# Patient Record
Sex: Male | Born: 1995 | Race: White | Hispanic: No | Marital: Single | Smoking: Never smoker
Health system: Southern US, Community
[De-identification: ages and names within clinical notes are randomized; demographics above are authoritative.]

## PROBLEM LIST (undated history)

## (undated) DIAGNOSIS — F419 Anxiety disorder, unspecified: Secondary | ICD-10-CM

## (undated) DIAGNOSIS — F845 Asperger's syndrome: Secondary | ICD-10-CM

## (undated) DIAGNOSIS — R51 Headache: Secondary | ICD-10-CM

## (undated) DIAGNOSIS — F988 Other specified behavioral and emotional disorders with onset usually occurring in childhood and adolescence: Secondary | ICD-10-CM

## (undated) HISTORY — DX: Headache: R51

## (undated) HISTORY — PX: CIRCUMCISION: SHX1350

---
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
## 2017-10-30 ENCOUNTER — Emergency Department (HOSPITAL_COMMUNITY): Payer: Medicaid Other

## 2017-10-30 ENCOUNTER — Encounter (HOSPITAL_COMMUNITY): Payer: Self-pay

## 2017-10-30 ENCOUNTER — Emergency Department (HOSPITAL_COMMUNITY)
Admission: EM | Admit: 2017-10-30 | Discharge: 2017-10-30 | Disposition: A | Discharge status: Home / Self Care | Payer: Medicaid Other | Attending: Emergency Medicine | Admitting: Emergency Medicine

## 2017-10-30 ENCOUNTER — Other Ambulatory Visit: Payer: Self-pay

## 2017-10-30 DIAGNOSIS — Z79899 Other long term (current) drug therapy: Secondary | ICD-10-CM | POA: Diagnosis not present

## 2017-10-30 DIAGNOSIS — N201 Calculus of ureter: Secondary | ICD-10-CM | POA: Insufficient documentation

## 2017-10-30 DIAGNOSIS — F845 Asperger's syndrome: Secondary | ICD-10-CM | POA: Insufficient documentation

## 2017-10-30 DIAGNOSIS — R1031 Right lower quadrant pain: Secondary | ICD-10-CM | POA: Diagnosis present

## 2017-10-30 LAB — URINALYSIS, ROUTINE W REFLEX MICROSCOPIC
Bacteria, UA: NONE SEEN
Bilirubin Urine: NEGATIVE
Glucose, UA: NEGATIVE mg/dL
Ketones, ur: NEGATIVE mg/dL
Leukocytes, UA: NEGATIVE
Nitrite: NEGATIVE
Protein, ur: 30 mg/dL — AB
RBC / HPF: >50 RBC/hpf — ABNORMAL HIGH (ref 0–5)
Specific Gravity, Urine: 1.019 (ref 1.005–1.030)
pH: 5 (ref 5.0–8.0)

## 2017-10-30 MED ORDER — ONDANSETRON HCL 4 MG PO TABS: 4.0000 mg | ORAL_TABLET | Freq: Three times a day (TID) | ORAL | 0 refills | Status: DC | PRN

## 2017-10-30 NOTE — Discharge Instructions (Addendum)
Drink plenty of fluids. You can take advil 3 tablets (600 mg) every 6 hrs as needed for pain. Use the zofran for nausea or vomiting.  Keep your appointment on Monday, September 30 with your primary care doctor.  Return to the emergency department if you get fever, or have uncontrolled vomiting or pain.  You should consider seeing a urologist.

## 2017-10-30 NOTE — ED Notes (Signed)
Pt attempted but was unable to provide urine sample 

## 2017-10-30 NOTE — ED Triage Notes (Signed)
Pt states a week ago he was diagnosed with uti and possible kidney stone.  Pt has been passing bright red blood intermittently since Tuesday.

## 2017-10-30 NOTE — ED Provider Notes (Signed)
[facility] EMERGENCY DEPARTMENT Provider Note   CSN: [id number] Arrival date & time: 10/30/17  0533  Time seen 05:58 AM   History   Chief Complaint Chief Complaint  Patient presents with  . Groin Pain    hematuria    HPI Joshua Duffy is a 22 y.o. male.  HPI patient reports he started seeing blood in his urine in September 24.  He states it is not every time he urinates but most times.  He states he has discomfort when he urinates.  He has had nausea without vomiting.  He has had some fleeting pains in his right abdomen but this morning at 3 AM he was having pain in his flank that radiated into his right lower quadrant and his right groin.  He states his right testicle is a little bit sore.  He was seen by his PCP on September 24 and was noted to have blood in his urine.  He has had crystals in his urine in the past.  He was started on Cipro 500 mg twice a day and he is to have a repeat urine done in the office on the 30th.  His grandmother who he lives with states his father's brother (uncle)  and several of his cousins on the maternal side of the family have kidney stones.  Patient took ibuprofen 400 mg at 4 AM.  PCP Assunta Found, MD   Past Medical History:  Diagnosis Date  . Asperger syndrome   . Attention deficit disorder   . WGNFAOZH(086.5)     Patient Active Problem List   Diagnosis Date Noted  . ADHD (attention deficit hyperactivity disorder) 06/29/2012  . Anxiety state, unspecified 06/29/2012  . Migraine without aura, without mention of intractable migraine without mention of status migrainosus 05/19/2012  . Tension headache 05/19/2012  . Unspecified mental or behavioral problem 05/19/2012    Past Surgical History:  Procedure Laterality Date  . CIRCUMCISION          Home Medications    Prior to Admission medications   Medication Sig Start Date End Date Taking? Authorizing Provider  ciprofloxacin (CIPRO) 500 MG tablet Take 500 mg by mouth 2 (two) times  daily.   Yes [provider]  cloNIDine (CATAPRES) 0.2 MG tablet Take 0.2 mg by mouth at bedtime.   Yes [provider]  GuanFACINE HCl 3 MG TB24 Take 1 tablet by mouth daily. 12/09/15  Yes [provider]  ibuprofen (ADVIL,MOTRIN) 200 MG tablet Take 200-800 mg by mouth every 6 (six) hours as needed for headache.   Yes [provider]  lisdexamfetamine (VYVANSE) 20 MG capsule Take 20 mg by mouth every morning.   Yes [provider]  montelukast (SINGULAIR) 5 MG chewable tablet Chew 5 mg by mouth at bedtime.   Yes [provider]  omeprazole (PRILOSEC) 20 MG capsule Take 20 mg by mouth at bedtime.   Yes [provider]  amoxicillin-clavulanate (AUGMENTIN) 875-125 MG tablet Take 1 tablet by mouth 2 (two) times daily. for 10 days 12/09/15   [provider]  ondansetron (ZOFRAN) 4 MG tablet Take 1 tablet (4 mg total) by mouth every 8 (eight) hours as needed. 10/30/17   Devoria Albe, MD    Family History Family History  Problem Relation Age of Onset  . Headache Mother   . Headache Maternal Grandmother   . Seizures Paternal Grandfather   . Headache Other     Social History Social History   Tobacco Use  .  Smoking status: Never Smoker  . Smokeless tobacco: Never Used  Substance Use Topics  . Alcohol use: No  . Drug use: No  lives with GM   Allergies   Codeine   Review of Systems Review of Systems  All other systems reviewed and are negative.    Physical Exam Updated Vital Signs BP (!) 143/90 (BP Location: Left Arm)   Pulse 92   Temp 97.8 F (36.6 C) (Oral)   Resp 18   Ht 5\' 6"  (1.676 m)   Wt 79.4 kg   SpO2 99%   BMI 28.25 kg/m   Vital signs normal    Physical Exam  Constitutional: He is oriented to person, place, and time. He appears well-developed and well-nourished.  Non-toxic appearance. He does not appear ill. No distress.  HENT:  Head: Normocephalic and atraumatic.  Right Ear: External ear  normal.  Left Ear: External ear normal.  Nose: Nose normal. No mucosal edema or rhinorrhea.  Mouth/Throat: Oropharynx is clear and moist and mucous membranes are normal. No dental abscesses or uvula swelling.  Eyes: Pupils are equal, round, and reactive to light. Conjunctivae and EOM are normal.  Neck: Normal range of motion and full passive range of motion without pain. Neck supple.  Cardiovascular: Normal rate, regular rhythm and normal heart sounds. Exam reveals no gallop and no friction rub.  No murmur heard. Pulmonary/Chest: Effort normal and breath sounds normal. No respiratory distress. He has no wheezes. He has no rhonchi. He has no rales. He exhibits no tenderness and no crepitus.  Abdominal: Soft. Normal appearance and bowel sounds are normal. He exhibits no distension. There is no tenderness. There is no rebound and no guarding.  Indicates he did have pain in his right flank earlier and in his right abdomen.  Genitourinary:  Genitourinary Comments: Patient's left testicle is nontender, the right testicle is the same size as the left and has mild tenderness but he is most tender more in the groin area.  Musculoskeletal: Normal range of motion. He exhibits no edema or tenderness.  Moves all extremities well.   Neurological: He is alert and oriented to person, place, and time. He has normal strength. No cranial nerve deficit.  Skin: Skin is warm, dry and intact. No rash noted. No erythema. No pallor.  Psychiatric: He has a normal mood and affect. His speech is normal and behavior is normal. His mood appears not anxious.  Nursing note and vitals reviewed.    ED Treatments / Results  Labs (all labs ordered are listed, but only abnormal results are displayed) Results for orders placed or performed during the hospital encounter of 10/30/17  Urinalysis, Routine w reflex microscopic  Result Value Ref Range   Color, Urine YELLOW YELLOW   APPearance HAZY (A) CLEAR   Specific Gravity,  Urine 1.019 1.005 - 1.030   pH 5.0 5.0 - 8.0   Glucose, UA NEGATIVE NEGATIVE mg/dL   Hgb urine dipstick LARGE (A) NEGATIVE   Bilirubin Urine NEGATIVE NEGATIVE   Ketones, ur NEGATIVE NEGATIVE mg/dL   Protein, ur 30 (A) NEGATIVE mg/dL   Nitrite NEGATIVE NEGATIVE   Leukocytes, UA NEGATIVE NEGATIVE   RBC / HPF >50 (H) 0 - 5 RBC/hpf   WBC, UA 0-5 0 - 5 WBC/hpf   Bacteria, UA NONE SEEN NONE SEEN   Squamous Epithelial / LPF 0-5 0 - 5   Mucus PRESENT    Hyaline Casts, UA PRESENT    Ca Oxalate Crys, UA PRESENT  Laboratory interpretation all normal except hematuria   EKG None  Radiology Ct Renal Stone Study  Result Date: 10/30/2017 CLINICAL DATA:  22 year old male with flank pain. Concern for kidney stone. EXAM: CT ABDOMEN AND PELVIS WITHOUT CONTRAST TECHNIQUE: Multidetector CT imaging of the abdomen and pelvis was performed following the standard protocol without IV contrast. COMPARISON:  None. FINDINGS: Evaluation of this exam is limited in the absence of intravenous contrast. Lower chest: The visualized lung bases are clear. No intra-abdominal free air or free fluid. Hepatobiliary: No focal liver abnormality is seen. No gallstones, gallbladder wall thickening, or biliary dilatation. Pancreas: Unremarkable. No pancreatic ductal dilatation or surrounding inflammatory changes. Spleen: Normal in size without focal abnormality. Adrenals/Urinary Tract: The adrenal glands are unremarkable. There is a 3 mm stone at the right ureterovesical junction. There is minimal fullness of the right ureter. Faint areas of increased density within the major renal calices bilaterally may be artifactual or represent tiny nonobstructing calculi. There is no hydronephrosis on either side. The urinary bladder is predominantly collapsed. Stomach/Bowel: Minimally prominent normal caliber loops of bowel in the pelvis measure up to 2 cm in diameter, likely mild reactive ileus. There is no bowel obstruction or active  inflammation. Normal appendix. Vascular/Lymphatic: The abdominal aorta and IVC are grossly unremarkable on this noncontrast CT. No portal venous gas. There is no adenopathy. Reproductive: The prostate and seminal vesicles are grossly unremarkable. No pelvic mass. Other: None Musculoskeletal: No acute or significant osseous findings. IMPRESSION: A 3 mm right UVJ calculus.  No hydronephrosis. Electronically Signed   By: Elgie Collard M.D.   On: 10/30/2017 06:37    Procedures Procedures (including critical care time)  Medications Ordered in ED Medications - No data to display   Initial Impression / Assessment and Plan / ED Course  I have reviewed the triage vital signs and the nursing notes.  Pertinent labs & imaging results that were available during my care of the patient were reviewed by me and considered in my medical decision making (see chart for details).     Patient was offered pain medication which he refused.  He states his pain is not bad at this time.  CT renal was done and urinalysis was ordered.  Recheck at 6:57 AM patient states he is still feeling much better than he was at home.  He does not need anything else for pain and he denies nausea.  We discussed his CT results.  He got good results with the Advil.  He can take Advil at home, drink plenty fluids, I will give him a prescription for nausea medicine to take if needed.  He can follow-up with his primary care doctor on Monday, September 30.  He should return to the ED for fever, uncontrolled vomiting, or severe pain not controlled by the Advil.  Final Clinical Impressions(s) / ED Diagnoses   Final diagnoses:  Right ureteral stone    ED Discharge Orders         Ordered    ondansetron (ZOFRAN) 4 MG tablet  Every 8 hours PRN     10/30/17 0707          Plan discharge  Devoria Albe, MD, Concha Pyo, MD 10/30/17 [phone]

## 2018-02-11 ENCOUNTER — Emergency Department (HOSPITAL_COMMUNITY)
Admission: EM | Admit: 2018-02-11 | Discharge: 2018-02-12 | Disposition: A | Discharge status: Home / Self Care | Payer: Medicaid Other | Attending: Emergency Medicine | Admitting: Emergency Medicine

## 2018-02-11 ENCOUNTER — Emergency Department (HOSPITAL_COMMUNITY): Payer: Medicaid Other

## 2018-02-11 ENCOUNTER — Other Ambulatory Visit: Payer: Self-pay

## 2018-02-11 ENCOUNTER — Encounter (HOSPITAL_COMMUNITY): Payer: Self-pay

## 2018-02-11 DIAGNOSIS — K219 Gastro-esophageal reflux disease without esophagitis: Secondary | ICD-10-CM | POA: Diagnosis not present

## 2018-02-11 DIAGNOSIS — R079 Chest pain, unspecified: Secondary | ICD-10-CM | POA: Insufficient documentation

## 2018-02-11 DIAGNOSIS — Z79899 Other long term (current) drug therapy: Secondary | ICD-10-CM | POA: Diagnosis not present

## 2018-02-11 DIAGNOSIS — R0789 Other chest pain: Secondary | ICD-10-CM

## 2018-02-11 LAB — BASIC METABOLIC PANEL
Anion gap: 9 (ref 5–15)
BUN: 8 mg/dL (ref 6–20)
CO2: 26 mmol/L (ref 22–32)
Calcium: 9.6 mg/dL (ref 8.9–10.3)
Chloride: 103 mmol/L (ref 98–111)
Creatinine, Ser: 0.88 mg/dL (ref 0.61–1.24)
GFR calc Af Amer: >60 mL/min (ref >60)
GFR calc non Af Amer: >60 mL/min (ref >60)
Glucose, Bld: 103 mg/dL — ABNORMAL HIGH (ref 70–99)
Potassium: 3.6 mmol/L (ref 3.5–5.1)
Sodium: 138 mmol/L (ref 135–145)

## 2018-02-11 LAB — CBC
HCT: 48.9 % (ref 39.0–52.0)
Hemoglobin: 16.4 g/dL (ref 13.0–17.0)
MCH: 29.5 pg (ref 26.0–34.0)
MCHC: 33.5 g/dL (ref 30.0–36.0)
MCV: 87.9 fL (ref 80.0–100.0)
Platelets: 239 10*3/uL (ref 150–400)
RBC: 5.56 MIL/uL (ref 4.22–5.81)
RDW: 12 % (ref 11.5–15.5)
WBC: 11.2 10*3/uL — ABNORMAL HIGH (ref 4.0–10.5)
nRBC: 0 % (ref 0.0–0.2)

## 2018-02-11 LAB — TROPONIN I: Troponin I: <0.03 ng/mL (ref <0.03)

## 2018-02-11 MED ORDER — KETOROLAC TROMETHAMINE 30 MG/ML IJ SOLN: 30.0000 mg | Freq: Once | INTRAMUSCULAR | Status: DC

## 2018-02-11 NOTE — ED Triage Notes (Signed)
Pt grandma (legal guardian) and pt report chest pain that started around 8 pm. Pt has had a cold in the past 2 weeks. Pt also has anxiety and grandma gave pt 0.5mg  Xanax prior to arrival, but chest pain is still present. Pt has hx of Asperger's syndrome. Some dizziness reported.

## 2018-02-11 NOTE — ED Provider Notes (Signed)
[facility]ENCY [facility] Provider Note   CSN: [id number] Arrival date & time: 02/11/18  2129     History   Chief Complaint Chief Complaint  Patient presents with  . Chest Pain    HPI Joshua HuaCharles E Duffy is a 23 y.o. male who presents with chest pain. PMH significant for Aspergers, hx of migraines, ADHD, GERD. Grandma is at bedside. The patient states that he started to have substernal chest pain that started at [facility] tonight while he was lying down. It was achy and severe. He started to have panic attack because it was hurting so much so grandma gave him a Xanax. He takes a PPI for GERD and took that earlier today. He ate tacos for dinner. The pain is now over the left side of the chest and radiates to the left arm. He denies fever, URI symptoms, cough, SOB, abdominal pain, vomiting, leg swelling. He is getting over a cold.   HPI  Past Medical History:  Diagnosis Date  . Asperger syndrome   . Attention deficit disorder   . MWUXLKGM(010.2Headache(784.0)     Patient Active Problem List   Diagnosis Date Noted  . ADHD (attention deficit hyperactivity disorder) 06/29/2012  . Anxiety state, unspecified 06/29/2012  . Migraine without aura, without mention of intractable migraine without mention of status migrainosus 05/19/2012  . Tension headache 05/19/2012  . Unspecified mental or behavioral problem 05/19/2012    Past Surgical History:  Procedure Laterality Date  . CIRCUMCISION          Home Medications    Prior to Admission medications   Medication Sig Start Date End Date Taking? Authorizing Provider  cloNIDine (CATAPRES) 0.2 MG tablet Take 0.2 mg by mouth at bedtime.   Yes [provider]  GuanFACINE HCl 3 MG TB24 Take 1 tablet by mouth every morning.  12/09/15  Yes [provider]  ibuprofen (ADVIL,MOTRIN) 200 MG tablet Take 200-800 mg by mouth every 6 (six) hours as needed for headache.   Yes [provider]  lisdexamfetamine (VYVANSE) 20 MG capsule  Take 20 mg by mouth every morning.   Yes [provider]  montelukast (SINGULAIR) 5 MG chewable tablet Chew 5 mg by mouth at bedtime.   Yes [provider]  omeprazole (PRILOSEC) 20 MG capsule Take 20 mg by mouth at bedtime.   Yes [provider]    Family History Family History  Problem Relation Age of Onset  . Headache Mother   . Headache Maternal Grandmother   . Seizures Paternal Grandfather   . Headache Other     Social History Social History   Tobacco Use  . Smoking status: Never Smoker  . Smokeless tobacco: Never Used  Substance Use Topics  . Alcohol use: No  . Drug use: No     Allergies   Codeine   Review of Systems Review of Systems  Constitutional: Negative for appetite change and fever.  Respiratory: Negative for cough and shortness of breath.   Cardiovascular: Positive for chest pain. Negative for palpitations and leg swelling.  Gastrointestinal: Positive for nausea. Negative for abdominal pain and vomiting.  Neurological: Negative for syncope and headaches.  All other systems reviewed and are negative.    Physical Exam Updated Vital Signs BP (!) 147/98 (BP Location: Right Arm)   Pulse 85   Temp 97.8 F (36.6 C) (Oral)   Resp 17   Ht 5\' 4"  (1.626 m)   Wt 82.1 kg   SpO2 99%   BMI  31.07 kg/m   Physical Exam Vitals signs and nursing note reviewed.  Constitutional:      General: He is not in acute distress.    Appearance: He is well-developed.     Comments: Calm and cooperative. NAD  HENT:     Head: Normocephalic and atraumatic.  Eyes:     General: No scleral icterus.       Right eye: No discharge.        Left eye: No discharge.     Conjunctiva/sclera: Conjunctivae normal.     Pupils: Pupils are equal, round, and reactive to light.  Neck:     Musculoskeletal: Normal range of motion.  Cardiovascular:     Rate and Rhythm: Normal rate and regular rhythm.     Pulses: Normal pulses.     Heart sounds: No murmur. No  friction rub. No gallop.   Pulmonary:     Effort: Pulmonary effort is normal. No respiratory distress.     Breath sounds: Normal breath sounds.  Abdominal:     General: Abdomen is flat. There is no distension.     Tenderness: There is no abdominal tenderness.  Musculoskeletal:     Comments: No lower leg edema  Skin:    General: Skin is warm and dry.  Neurological:     Mental Status: He is alert and oriented to person, place, and time.  Psychiatric:        Behavior: Behavior normal.      ED Treatments / Results  Labs (all labs ordered are listed, but only abnormal results are displayed) Labs Reviewed  BASIC METABOLIC PANEL - Abnormal; Notable for the following components:      Result Value   Glucose, Bld 103 (*)    All other components within normal limits  CBC - Abnormal; Notable for the following components:   WBC 11.2 (*)    All other components within normal limits  TROPONIN I    EKG EKG Interpretation  Date/Time:  Friday February 11 2018 21:41:25 EST Ventricular Rate:  85 PR Interval:  120 QRS Duration: 100 QT Interval:  336 QTC Calculation: 399 R Axis:   55 Text Interpretation:  Normal sinus rhythm Right atrial enlargement Minimal voltage criteria for LVH, may be normal variant Nonspecific T wave abnormality Abnormal ECG Confirmed by Donnetta Hutching ([id number]) on 02/11/2018 11:22:28 PM   Radiology Dg Chest 2 View  Result Date: 02/11/2018 CLINICAL DATA:  Bilateral anterior upper and lower chest pain. Cough, fever, chills for 2 weeks. EXAM: CHEST - 2 VIEW COMPARISON:  11/25/2005 FINDINGS: The heart size and mediastinal contours are within normal limits. Both lungs are clear. The visualized skeletal structures are unremarkable. IMPRESSION: No active cardiopulmonary disease. Electronically Signed   By: Burman Nieves M.D.   On: 02/11/2018 21:59    Procedures Procedures (including critical care time)  Medications Ordered in ED Medications  ketorolac (TORADOL) 30  MG/ML injection 30 mg (30 mg Intramuscular Given 02/12/18 0028)     Initial Impression / Assessment and Plan / ED Course  I have reviewed the triage vital signs and the nursing notes.  Pertinent labs & imaging results that were available during my care of the patient were reviewed by me and considered in my medical decision making (see chart for details).  23 year old male presents with chest pain. He is mildly hypertensive on arrival. This has improved on recheck. Chest pain is reproducible on exam. Chest pain work up is reassuring. Doubt ACS, PE, pericarditis, esophageal  rupture, tension pneumothorax, aortic dissection, cardiac tamponade. EKG is NSR. CXR is negative. Troponin is 0. Labs are unremarkable. He is PERC negative. Will not repeat troponin as this was obtained in triage and not consistent with ACS. He was given Toradol and on recheck feels better. Grandma thinks symptoms are from anxiety because he was panicking about an assignment due on Monday and then started to have pain. Advised f/u with PCP and return if worsening  Final Clinical Impressions(s) / ED Diagnoses   Final diagnoses:  Atypical chest pain    ED Discharge Orders    None       Bethel Born, PA-C 02/12/18 1514    Zadie Rhine, MD 02/12/18 2308

## 2018-02-12 MED ORDER — KETOROLAC TROMETHAMINE 30 MG/ML IJ SOLN
30.0000 mg | Freq: Once | INTRAMUSCULAR | Status: AC
  Administered 2018-02-12: 30 mg via INTRAMUSCULAR
  Filled 2018-02-12: qty 1

## 2018-02-12 NOTE — Discharge Instructions (Signed)
Please call your doctor for follow up appointment Return if worsening

## 2018-03-04 ENCOUNTER — Other Ambulatory Visit (HOSPITAL_COMMUNITY): Payer: Self-pay | Admitting: Family Medicine

## 2018-03-04 ENCOUNTER — Ambulatory Visit (HOSPITAL_COMMUNITY)
Admission: RE | Admit: 2018-03-04 | Discharge: 2018-03-04 | Disposition: A | Discharge status: Home / Self Care | Payer: Medicaid Other | Source: Ambulatory Visit | Attending: Family Medicine | Admitting: Family Medicine

## 2018-03-04 DIAGNOSIS — R05 Cough: Secondary | ICD-10-CM | POA: Insufficient documentation

## 2018-03-04 DIAGNOSIS — R059 Cough, unspecified: Secondary | ICD-10-CM

## 2018-03-19 ENCOUNTER — Emergency Department (HOSPITAL_COMMUNITY)
Admission: EM | Admit: 2018-03-19 | Discharge: 2018-03-19 | Disposition: A | Discharge status: Home / Self Care | Payer: Medicaid Other | Attending: Emergency Medicine | Admitting: Emergency Medicine

## 2018-03-19 ENCOUNTER — Encounter (HOSPITAL_COMMUNITY): Payer: Self-pay | Admitting: Emergency Medicine

## 2018-03-19 DIAGNOSIS — Z79899 Other long term (current) drug therapy: Secondary | ICD-10-CM | POA: Insufficient documentation

## 2018-03-19 DIAGNOSIS — G43109 Migraine with aura, not intractable, without status migrainosus: Secondary | ICD-10-CM | POA: Diagnosis not present

## 2018-03-19 DIAGNOSIS — R51 Headache: Secondary | ICD-10-CM | POA: Diagnosis present

## 2018-03-19 HISTORY — DX: Anxiety disorder, unspecified: F41.9

## 2018-03-19 MED ORDER — SODIUM CHLORIDE 0.9 % IV BOLUS
500.0000 mL | Freq: Once | INTRAVENOUS | Status: AC
  Administered 2018-03-19: 500 mL via INTRAVENOUS

## 2018-03-19 MED ORDER — KETOROLAC TROMETHAMINE 30 MG/ML IJ SOLN
30.0000 mg | Freq: Once | INTRAMUSCULAR | Status: AC
  Administered 2018-03-19: 30 mg via INTRAVENOUS
  Filled 2018-03-19: qty 1

## 2018-03-19 MED ORDER — PROMETHAZINE HCL 25 MG/ML IJ SOLN
INTRAMUSCULAR | Status: AC
  Administered 2018-03-19: 25 mg via INTRAVENOUS
  Filled 2018-03-19: qty 1

## 2018-03-19 MED ORDER — PROMETHAZINE HCL 25 MG/ML IJ SOLN
25.0000 mg | Freq: Once | INTRAMUSCULAR | Status: AC
  Administered 2018-03-19: 25 mg via INTRAVENOUS

## 2018-03-19 NOTE — ED Provider Notes (Signed)
[facility] EMERGENCY DEPARTMENT Provider Note   CSN: [id number] Arrival date & time: 03/19/18  1705     History   Chief Complaint Chief Complaint  Patient presents with  . Headache    HPI Joshua Duffy is a 23 y.o. male.  HPI Patient presents with a headache.  Woke with this morning.  Does have a history of headaches but is not had any that recently.  Throbbing on the left side of the head.  Reportedly had some numbness in his arms and legs to.  Has had previous negative MRIs and is seen neurology in the past.  He took 4 naproxen and was given 2 Xanax because he was anxious.  Also states that headache did get worse with masturbation today.  No fevers.  No vision changes. Past Medical History:  Diagnosis Date  . Anxiety   . Asperger syndrome   . Attention deficit disorder   . BDZHGDJM(426.8)     Patient Active Problem List   Diagnosis Date Noted  . ADHD (attention deficit hyperactivity disorder) 06/29/2012  . Anxiety state, unspecified 06/29/2012  . Migraine without aura, without mention of intractable migraine without mention of status migrainosus 05/19/2012  . Tension headache 05/19/2012  . Unspecified mental or behavioral problem 05/19/2012    Past Surgical History:  Procedure Laterality Date  . CIRCUMCISION          Home Medications    Prior to Admission medications   Medication Sig Start Date End Date Taking? Authorizing Provider  albuterol (PROVENTIL HFA;VENTOLIN HFA) 108 (90 Base) MCG/ACT inhaler Inhale 1-2 puffs into the lungs every 6 (six) hours as needed for wheezing or shortness of breath.   Yes [provider]  chlorpheniramine-HYDROcodone (TUSSIONEX PENNKINETIC ER) 10-8 MG/5ML SUER Take 5 mLs by mouth every 12 (twelve) hours as needed for cough.   Yes [provider]  cloNIDine (CATAPRES) 0.2 MG tablet Take 0.2 mg by mouth at bedtime.   Yes [provider]  fluticasone (FLONASE) 50 MCG/ACT nasal spray Place 2 sprays into  both nostrils daily as needed for allergies or rhinitis.   Yes [provider]  GuanFACINE HCl 3 MG TB24 Take 1 tablet by mouth every morning.  12/09/15  Yes [provider]  hydrOXYzine (ATARAX/VISTARIL) 25 MG tablet Take 25 mg by mouth daily as needed for anxiety.   Yes [provider]  ibuprofen (ADVIL,MOTRIN) 200 MG tablet Take 200-800 mg by mouth every 6 (six) hours as needed for headache.   Yes [provider]  lisdexamfetamine (VYVANSE) 20 MG capsule Take 20 mg by mouth every morning.   Yes [provider]  montelukast (SINGULAIR) 5 MG chewable tablet Chew 5 mg by mouth at bedtime.   Yes [provider]  omeprazole (PRILOSEC) 20 MG capsule Take 20 mg by mouth at bedtime.   Yes [provider]  PREDNISONE PO Take by mouth See admin instructions. Take 5 tablets daily for 3 days, then 4 tabs daily for 3 days, then 3 tabs daily for 3 days, then 2 tabs daily for 3 days, then 1 tab daily for 3 days, then STOP    [provider]    Family History Family History  Problem Relation Age of Onset  . Headache Mother   . Headache Maternal Grandmother   . Seizures Paternal Grandfather   . Headache Other     Social History Social History   Tobacco Use  . Smoking status: Never Smoker  . Smokeless tobacco:  Never Used  Substance Use Topics  . Alcohol use: No  . Drug use: No     Allergies   Codeine   Review of Systems Review of Systems  Constitutional: Negative for appetite change.  HENT: Negative for congestion.   Eyes: Negative for visual disturbance.  Respiratory: Negative for shortness of breath.   Cardiovascular: Negative for chest pain.  Gastrointestinal: Positive for nausea and vomiting.  Genitourinary: Negative for enuresis.  Musculoskeletal: Negative for back pain.  Skin: Negative for wound.  Neurological: Positive for weakness and headaches.  Psychiatric/Behavioral: Negative for confusion.      Physical Exam Updated Vital Signs BP (!) 135/92   Pulse 74   Temp (!) 96.9 F (36.1 C) (Oral)   Resp 18   Ht 5\' 6"  (1.676 m)   Wt 81.2 kg   SpO2 100%   BMI 28.89 kg/m   Physical Exam HENT:     Head: Atraumatic.  Eyes:     Extraocular Movements: Extraocular movements intact.     Pupils: Pupils are equal, round, and reactive to light.  Neck:     Musculoskeletal: Neck supple.  Cardiovascular:     Rate and Rhythm: Normal rate.  Pulmonary:     Breath sounds: Normal breath sounds.  Abdominal:     Palpations: Abdomen is soft.  Musculoskeletal: Normal range of motion.  Skin:    General: Skin is warm.  Neurological:     Mental Status: He is alert.     Cranial Nerves: No cranial nerve deficit.     Sensory: No sensory deficit.     Comments: Good grip strength bilaterally.  Moving all extremities.  Normal speech.      ED Treatments / Results  Labs (all labs ordered are listed, but only abnormal results are displayed) Labs Reviewed - No data to display  EKG None  Radiology No results found.  Procedures Procedures (including critical care time)  Medications Ordered in ED Medications  ketorolac (TORADOL) 30 MG/ML injection 30 mg (30 mg Intravenous Given 03/19/18 1737)  promethazine (PHENERGAN) injection 25 mg (25 mg Intravenous Given 03/19/18 1738)  sodium chloride 0.9 % bolus 500 mL (0 mLs Intravenous Stopped 03/19/18 1837)     Initial Impression / Assessment and Plan / ED Course  I have reviewed the triage vital signs and the nursing notes.  Pertinent labs & imaging results that were available during my care of the patient were reviewed by me and considered in my medical decision making (see chart for details).     Patient with headache.  Reportedly had a weakness in his arms and legs.  Complicated migraine possible.  Also could have been anxiety provoked from the migraine.  Much better after treatment.  Discharge home and may need neurology  follow-up  Final Clinical Impressions(s) / ED Diagnoses   Final diagnoses:  Complicated migraine    ED Discharge Orders    None       Benjiman Core, MD 03/19/18 2319

## 2018-03-19 NOTE — ED Triage Notes (Addendum)
Pt states he has had a bad headache since he woke up this morning with numbness in his legs. Took Advil with no relief.  Last went to neurologist quite some time ago with no recommendations.  Grandmother gave 2 xanax 0.5mg  due to patient hyperventilating and was out of control and pt has calmed down some.  States she was unable to handle him as he kept repeating he was dying and was very upset.

## 2018-11-11 ENCOUNTER — Other Ambulatory Visit: Payer: Self-pay

## 2018-11-11 DIAGNOSIS — Z20822 Contact with and (suspected) exposure to covid-19: Secondary | ICD-10-CM

## 2018-11-13 LAB — NOVEL CORONAVIRUS, NAA: SARS-CoV-2, NAA: NOT DETECTED

## 2019-02-09 ENCOUNTER — Ambulatory Visit: Payer: Medicaid Other | Attending: Internal Medicine

## 2019-02-09 ENCOUNTER — Other Ambulatory Visit: Payer: Self-pay

## 2019-02-09 DIAGNOSIS — Z20822 Contact with and (suspected) exposure to covid-19: Secondary | ICD-10-CM

## 2019-02-10 LAB — NOVEL CORONAVIRUS, NAA: SARS-CoV-2, NAA: NOT DETECTED

## 2019-10-28 ENCOUNTER — Encounter: Payer: Self-pay | Admitting: Emergency Medicine

## 2019-10-28 ENCOUNTER — Ambulatory Visit
Admission: EM | Admit: 2019-10-28 | Discharge: 2019-10-28 | Disposition: A | Discharge status: Home / Self Care | Payer: Medicaid Other | Attending: Emergency Medicine | Admitting: Emergency Medicine

## 2019-10-28 DIAGNOSIS — H60392 Other infective otitis externa, left ear: Secondary | ICD-10-CM | POA: Diagnosis not present

## 2019-10-28 MED ORDER — NEOMYCIN-POLYMYXIN-HC 3.5-10000-1 OT SUSP: 4.0000 [drp] | Freq: Three times a day (TID) | OTIC | 0 refills | Status: AC

## 2019-10-28 NOTE — ED Triage Notes (Signed)
Left ear pain that feels full x 1 day

## 2019-10-28 NOTE — ED Provider Notes (Signed)
[facility]   [id number] 10/28/19 Arrival Time: 3785  CC:EAR PAIN  SUBJECTIVE: History from: patient.  Joshua Duffy is a 24 y.o. male presented to the urgent care with a complaint of left ear pain for and fullness for the past 1 day.  Denies a precipitating event, such as swimming or wearing ear plugs.  Patient states the pain is constant and achy in character.  Patient has not tried any medication.  Symptoms are made worse with lying down.  Denies similar symptoms in the past.   Denies fever, chills, fatigue, sinus pain, rhinorrhea, ear discharge, sore throat, SOB, wheezing, chest pain, nausea, changes in bowel or bladder habits.    ROS: As per HPI.  All other pertinent ROS negative.     Past Medical History:  Diagnosis Date  . Anxiety   . Asperger syndrome   . Attention deficit disorder   . YIFOYDXA(128.7)    Past Surgical History:  Procedure Laterality Date  . CIRCUMCISION     Allergies  Allergen Reactions  . Codeine Nausea And Vomiting   No current facility-administered medications on file prior to encounter.   Current Outpatient Medications on File Prior to Encounter  Medication Sig Dispense Refill  . albuterol (PROVENTIL HFA;VENTOLIN HFA) 108 (90 Base) MCG/ACT inhaler Inhale 1-2 puffs into the lungs every 6 (six) hours as needed for wheezing or shortness of breath.    . chlorpheniramine-HYDROcodone (TUSSIONEX PENNKINETIC ER) 10-8 MG/5ML SUER Take 5 mLs by mouth every 12 (twelve) hours as needed for cough.    . cloNIDine (CATAPRES) 0.2 MG tablet Take 0.2 mg by mouth at bedtime.    . fluticasone (FLONASE) 50 MCG/ACT nasal spray Place 2 sprays into both nostrils daily as needed for allergies or rhinitis.    . GuanFACINE HCl 3 MG TB24 Take 1 tablet by mouth every morning.   11  . hydrOXYzine (ATARAX/VISTARIL) 25 MG tablet Take 25 mg by mouth daily as needed for anxiety.    Marland Kitchen ibuprofen (ADVIL,MOTRIN) 200 MG tablet Take 200-800 mg by mouth every 6 (six) hours as  needed for headache.    . lisdexamfetamine (VYVANSE) 20 MG capsule Take 20 mg by mouth every morning.    . montelukast (SINGULAIR) 5 MG chewable tablet Chew 5 mg by mouth at bedtime.    Marland Kitchen omeprazole (PRILOSEC) 20 MG capsule Take 20 mg by mouth at bedtime.    Marland Kitchen PREDNISONE PO Take by mouth See admin instructions. Take 5 tablets daily for 3 days, then 4 tabs daily for 3 days, then 3 tabs daily for 3 days, then 2 tabs daily for 3 days, then 1 tab daily for 3 days, then STOP     Social History   Socioeconomic History  . Marital status: Single    Spouse name: Not on file  . Number of children: Not on file  . Years of education: Not on file  . Highest education level: Not on file  Occupational History  . Not on file  Tobacco Use  . Smoking status: Never Smoker  . Smokeless tobacco: Never Used  Vaping Use  . Vaping Use: Never used  Substance and Sexual Activity  . Alcohol use: No  . Drug use: No  . Sexual activity: Never  Other Topics Concern  . Not on file  Social History Narrative  . Not on file   Social Determinants of Health   Financial Resource Strain:   . Difficulty of Paying Living Expenses: Not on file  Food  Insecurity:   . Worried About Programme researcher, broadcasting/film/video in the Last Year: Not on file  . Ran Out of Food in the Last Year: Not on file  Transportation Needs:   . Lack of Transportation (Medical): Not on file  . Lack of Transportation (Non-Medical): Not on file  Physical Activity:   . Days of Exercise per Week: Not on file  . Minutes of Exercise per Session: Not on file  Stress:   . Feeling of Stress : Not on file  Social Connections:   . Frequency of Communication with Friends and Family: Not on file  . Frequency of Social Gatherings with Friends and Family: Not on file  . Attends Religious Services: Not on file  . Active Member of Clubs or Organizations: Not on file  . Attends Banker Meetings: Not on file  . Marital Status: Not on file  Intimate  Partner Violence:   . Fear of Current or Ex-Partner: Not on file  . Emotionally Abused: Not on file  . Physically Abused: Not on file  . Sexually Abused: Not on file   Family History  Problem Relation Age of Onset  . Headache Mother   . Headache Maternal Grandmother   . Seizures Paternal Grandfather   . Headache Other     OBJECTIVE:  Vitals:   10/28/19 0830  BP: (!) 143/96  Pulse: 95  Resp: 16  Temp: 98.1 F (36.7 C)  SpO2: 98%     Physical Exam Vitals and nursing note reviewed.  Constitutional:      General: He is not in acute distress.    Appearance: Normal appearance. He is normal weight. He is not ill-appearing, toxic-appearing or diaphoretic.  HENT:     Right Ear: Tympanic membrane, ear canal and external ear normal. There is no impacted cerumen.     Left Ear: Tympanic membrane and external ear normal. Tenderness present. There is no impacted cerumen. Tympanic membrane is not erythematous or bulging.     Ears:     Comments: Tenderness and swelling in left ear canal Cardiovascular:     Rate and Rhythm: Normal rate and regular rhythm.     Pulses: Normal pulses.     Heart sounds: Normal heart sounds. No murmur heard.  No friction rub. No gallop.   Pulmonary:     Effort: Pulmonary effort is normal. No respiratory distress.     Breath sounds: Normal breath sounds. No stridor. No wheezing, rhonchi or rales.  Chest:     Chest wall: No tenderness.  Neurological:     Mental Status: He is alert and oriented to person, place, and time.      Imaging: No results found.   ASSESSMENT & PLAN:  1. Infective otitis externa of left ear     Meds ordered this encounter  Medications  . neomycin-polymyxin-hydrocortisone (CORTISPORIN) 3.5-10000-1 OTIC suspension    Sig: Place 4 drops into the left ear 3 (three) times daily.    Dispense:  10 mL    Refill:  0   Discharge instructions  Rest and drink plenty of fluids Prescribed Corticosporin ear drops Take  medications as directed and to completion Continue to use OTC ibuprofen and/ or tylenol as needed for pain control Follow up with PCP if symptoms persists Return here or go to the ER if you have any new or worsening symptoms   Reviewed expectations re: course of current medical issues. Questions answered. Outlined signs and symptoms indicating need for more acute  intervention. Patient verbalized understanding. After Visit Summary given.         Durward Parcel, FNP 10/28/19 [phone]

## 2019-10-28 NOTE — Discharge Instructions (Addendum)
Rest and drink plenty of fluids Prescribed Corticosporin ear drops Take medications as directed and to completion Continue to use OTC ibuprofen and/ or tylenol as needed for pain control Follow up with PCP if symptoms persists Return here or go to the ER if you have any new or worsening symptoms

## 2020-09-30 IMAGING — DX DG CHEST 2V
2 series · 2 of 2 positions shown · non-contrast
Comparison: 02/11/2018

CLINICAL DATA: Cough for 2 months.

EXAM:
CHEST - 2 VIEW

[chest pa]
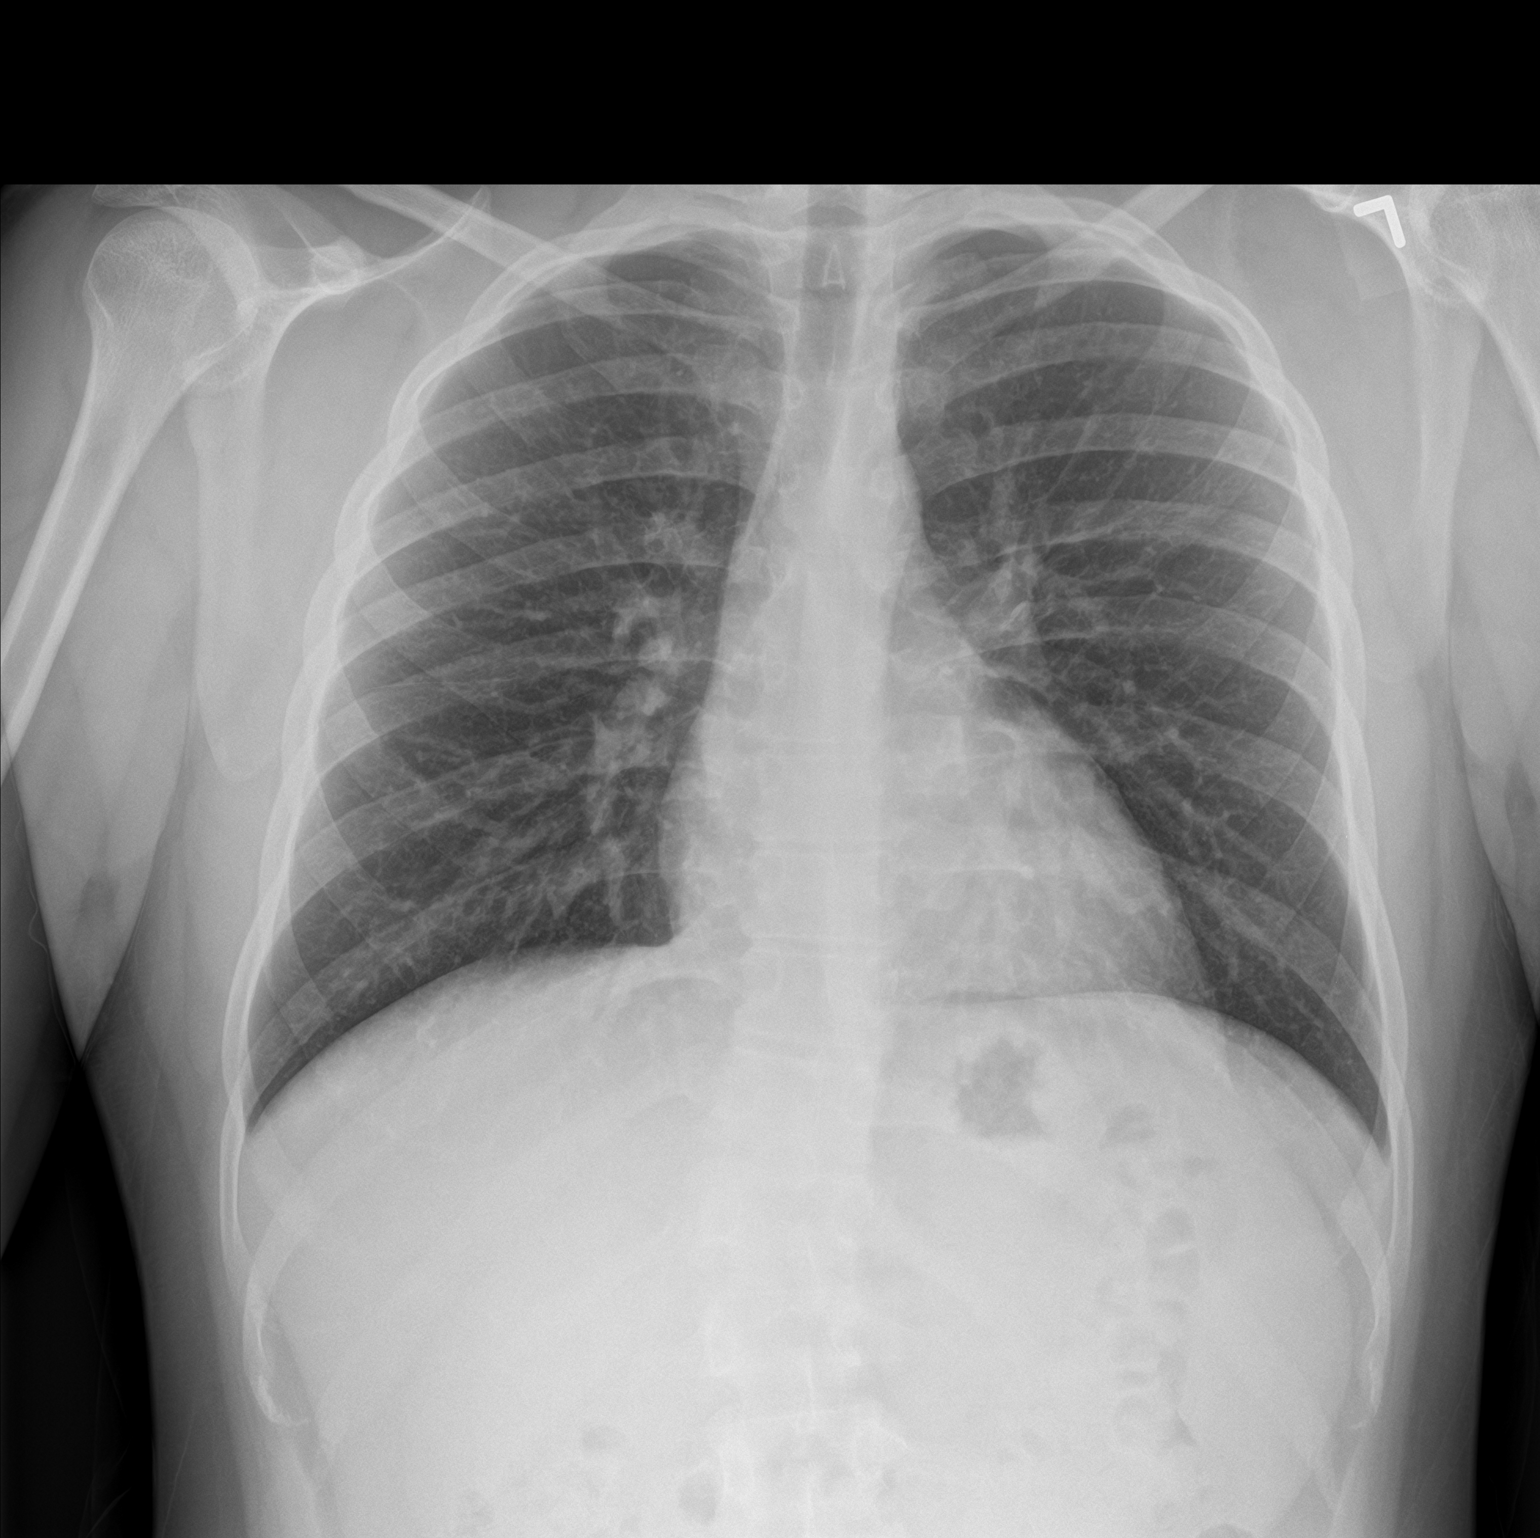

[chest lat]
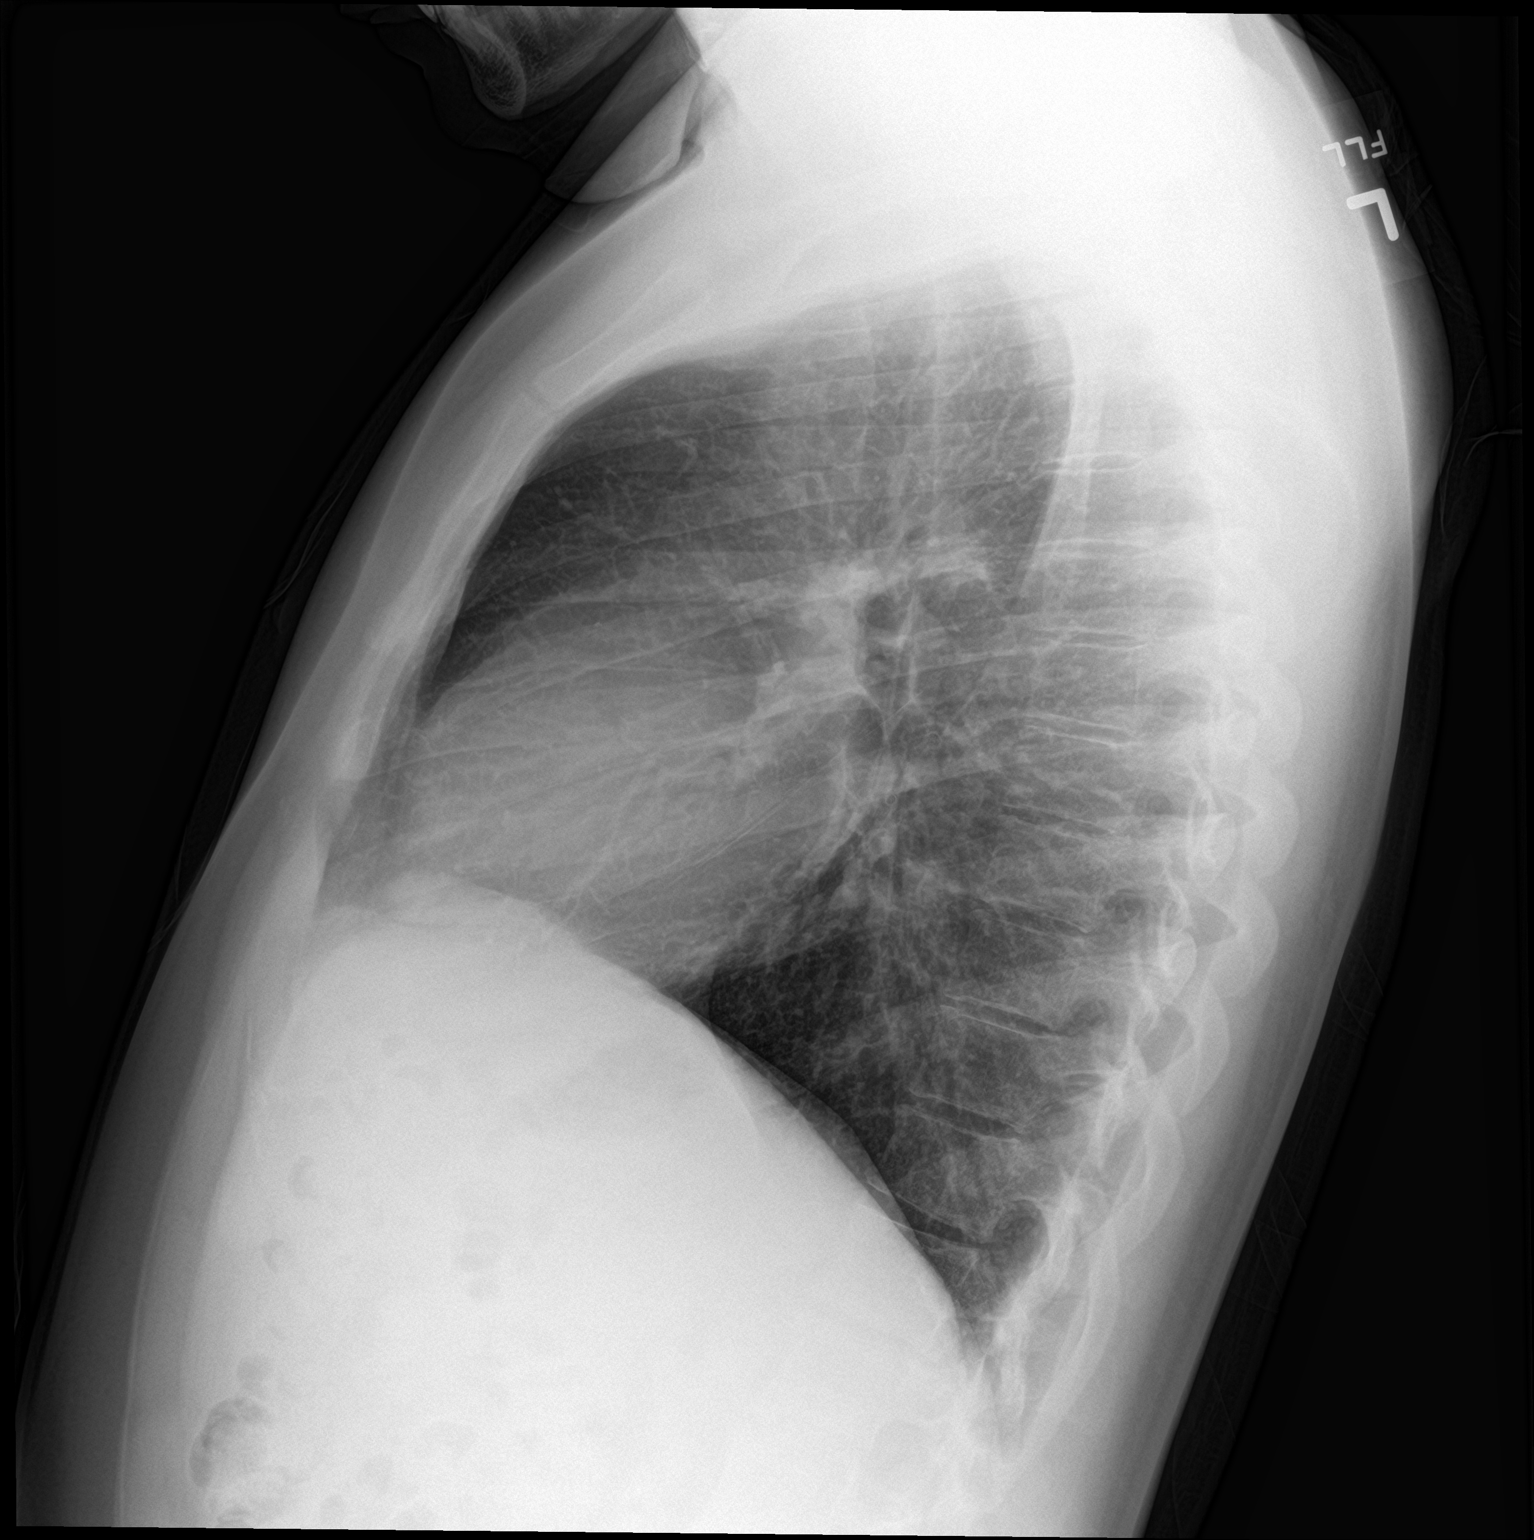

[2 of 2 positions shown; findings below may reference images not displayed]

FINDINGS: The heart size and mediastinal contours are within normal limits.
Both lungs are clear. The visualized skeletal structures are
unremarkable.
IMPRESSION: No active cardiopulmonary disease.

## 2021-09-05 ENCOUNTER — Emergency Department (HOSPITAL_COMMUNITY)
Admission: EM | Admit: 2021-09-05 | Discharge: 2021-09-05 | Disposition: A | Discharge status: Home / Self Care | Payer: Medicaid Other | Attending: Emergency Medicine | Admitting: Emergency Medicine

## 2021-09-05 ENCOUNTER — Other Ambulatory Visit: Payer: Self-pay

## 2021-09-05 ENCOUNTER — Encounter (HOSPITAL_COMMUNITY): Payer: Self-pay | Admitting: Emergency Medicine

## 2021-09-05 ENCOUNTER — Emergency Department (HOSPITAL_COMMUNITY): Payer: Medicaid Other

## 2021-09-05 DIAGNOSIS — R000 Tachycardia, unspecified: Secondary | ICD-10-CM | POA: Diagnosis not present

## 2021-09-05 DIAGNOSIS — R1030 Lower abdominal pain, unspecified: Secondary | ICD-10-CM | POA: Insufficient documentation

## 2021-09-05 DIAGNOSIS — R300 Dysuria: Secondary | ICD-10-CM | POA: Diagnosis not present

## 2021-09-05 DIAGNOSIS — R319 Hematuria, unspecified: Secondary | ICD-10-CM | POA: Diagnosis present

## 2021-09-05 DIAGNOSIS — M549 Dorsalgia, unspecified: Secondary | ICD-10-CM | POA: Diagnosis not present

## 2021-09-05 DIAGNOSIS — R110 Nausea: Secondary | ICD-10-CM | POA: Diagnosis not present

## 2021-09-05 DIAGNOSIS — R310 Gross hematuria: Secondary | ICD-10-CM | POA: Diagnosis not present

## 2021-09-05 DIAGNOSIS — F840 Autistic disorder: Secondary | ICD-10-CM | POA: Insufficient documentation

## 2021-09-05 LAB — URINALYSIS, ROUTINE W REFLEX MICROSCOPIC
Bacteria, UA: NONE SEEN
Bilirubin Urine: NEGATIVE
Glucose, UA: 50 mg/dL — AB
Ketones, ur: 5 mg/dL — AB
Leukocytes,Ua: NEGATIVE
Nitrite: NEGATIVE
Protein, ur: >=300 mg/dL — AB
RBC / HPF: >50 RBC/hpf — ABNORMAL HIGH (ref 0–5)
Specific Gravity, Urine: 1.029 (ref 1.005–1.030)
WBC, UA: >50 WBC/hpf — ABNORMAL HIGH (ref 0–5)
pH: 6 (ref 5.0–8.0)

## 2021-09-05 MED ORDER — ONDANSETRON HCL 4 MG PO TABS: 4.0000 mg | ORAL_TABLET | Freq: Four times a day (QID) | ORAL | 0 refills | Status: AC

## 2021-09-05 NOTE — Discharge Instructions (Addendum)
You were seen in the emergency department for your back pain and blood in your urine.  Your urine showed no signs of infection.  You had a CAT scan that showed no signs of kidney stone, appendicitis or other causes for your hematuria.  It is possible that you may have had a kidney stone that you have already passed and that is why it was not seen on CAT scan.  Your CAT scan did show some inflamed lymph nodes in the right lower part of your abdomen which may just be reactive to what ever is causing your pain.  If you are continuing to have hematuria however you should follow-up with the urologist for further work-up.  You can otherwise follow-up with your primary doctor.  You can continue to take Tylenol or Motrin as needed for pain and I have given you prescription for a nausea medicine that you can take as needed.  You should return to the emergency department if your pain is getting significantly worse, you have repetitive vomiting despite the nausea medicine, you have fevers, or if you have any other new or concerning symptoms.

## 2021-09-05 NOTE — ED Provider Notes (Signed)
[facility] EMERGENCY DEPARTMENT Provider Note   CSN: [id number] Arrival date & time: 09/05/21  1036     History  Chief Complaint  Patient presents with   Back Pain    Joshua Duffy is a 26 y.o. male.  [name] with a past medical history of autism and kidney stones presenting to the emergency department with back pain and hematuria.  The patient states that yesterday around 5:00 PM he woke up from a nap with some right lower back pain.  He states that he did not have any previous trauma or falls, recent heavy lifting or changes in activity.  He states that this morning he started to notice blood in his urine.  He states he was seen at his primary doctor who recommended that he come to the emergency department to be evaluated for kidney stones.  He states he has mild dysuria and lower abdominal pain.  He denies any fevers or chills.  He states he has some nausea but denies any vomiting.  He denies any numbness or weakness in his arms or legs.  He denies any saddle anesthesia loss of bowel or bladder control.  He states he has had 1 kidney stone in the past that he was able to pass on his own.  The history is provided by the patient.  Back Pain      Home Medications Prior to Admission medications   Medication Sig Start Date End Date Taking? Authorizing Provider  ondansetron (ZOFRAN) 4 MG tablet Take 1 tablet (4 mg total) by mouth every 6 (six) hours. 09/05/21  Yes Hershey Knauer, Turkey K, DO  albuterol (PROVENTIL HFA;VENTOLIN HFA) 108 (90 Base) MCG/ACT inhaler Inhale 1-2 puffs into the lungs every 6 (six) hours as needed for wheezing or shortness of breath.    [provider]  chlorpheniramine-HYDROcodone (TUSSIONEX PENNKINETIC ER) 10-8 MG/5ML SUER Take 5 mLs by mouth every 12 (twelve) hours as needed for cough.    [provider]  cloNIDine (CATAPRES) 0.2 MG tablet Take 0.2 mg by mouth at bedtime.    [provider]  fluticasone (FLONASE) 50  MCG/ACT nasal spray Place 2 sprays into both nostrils daily as needed for allergies or rhinitis.    [provider]  GuanFACINE HCl 3 MG TB24 Take 1 tablet by mouth every morning.  12/09/15   [provider]  hydrOXYzine (ATARAX/VISTARIL) 25 MG tablet Take 25 mg by mouth daily as needed for anxiety.    [provider]  ibuprofen (ADVIL,MOTRIN) 200 MG tablet Take 200-800 mg by mouth every 6 (six) hours as needed for headache.    [provider]  lisdexamfetamine (VYVANSE) 20 MG capsule Take 20 mg by mouth every morning.    [provider]  montelukast (SINGULAIR) 5 MG chewable tablet Chew 5 mg by mouth at bedtime.    [provider]  neomycin-polymyxin-hydrocortisone (CORTISPORIN) 3.5-10000-1 OTIC suspension Place 4 drops into the left ear 3 (three) times daily. 10/28/19   Avegno, Zachery Dakins, FNP  omeprazole (PRILOSEC) 20 MG capsule Take 20 mg by mouth at bedtime.    [provider]  PREDNISONE PO Take by mouth See admin instructions. Take 5 tablets daily for 3 days, then 4 tabs daily for 3 days, then 3 tabs daily for 3 days, then 2 tabs daily for 3 days, then 1 tab daily for 3 days, then STOP    [provider]      Allergies    Codeine  Review of Systems   Review of Systems  Musculoskeletal:  Positive for back pain.    Physical Exam Updated Vital Signs BP 132/76   Pulse 89   Temp 98.2 F (36.8 C) (Oral)   Resp 16   Ht 5\' 6"  (1.676 m)   Wt 87.1 kg   SpO2 98%   BMI 30.99 kg/m  Physical Exam Vitals and nursing note reviewed.  Constitutional:      General: He is not in acute distress.    Appearance: Normal appearance.  HENT:     Head: Normocephalic and atraumatic.     Mouth/Throat:     Mouth: Mucous membranes are moist.  Eyes:     Extraocular Movements: Extraocular movements intact.     Conjunctiva/sclera: Conjunctivae normal.  Cardiovascular:     Rate and Rhythm: Regular rhythm. Tachycardia present.   Pulmonary:     Effort: Pulmonary effort is normal.     Breath sounds: Normal breath sounds.  Abdominal:     General: Abdomen is flat.     Palpations: Abdomen is soft. There is no mass.     Tenderness: There is abdominal tenderness (Mild tenderness to palpation across the lower abdomen). There is no right CVA tenderness, left CVA tenderness, guarding or rebound.     Hernia: No hernia is present.  Musculoskeletal:        General: Normal range of motion.     Cervical back: Normal range of motion and neck supple.     Right lower leg: No edema.     Left lower leg: No edema.  Skin:    General: Skin is warm and dry.  Neurological:     General: No focal deficit present.     Mental Status: He is alert and oriented to person, place, and time.  Psychiatric:        Mood and Affect: Mood normal.        Behavior: Behavior normal.     ED Results / Procedures / Treatments   Labs (all labs ordered are listed, but only abnormal results are displayed) Labs Reviewed  URINALYSIS, ROUTINE W REFLEX MICROSCOPIC - Abnormal; Notable for the following components:      Result Value   Color, Urine AMBER (*)    APPearance CLOUDY (*)    Glucose, UA 50 (*)    Hgb urine dipstick LARGE (*)    Ketones, ur 5 (*)    Protein, ur >=300 (*)    RBC / HPF >50 (*)    WBC, UA >50 (*)    All other components within normal limits  CBG MONITORING, ED    EKG None  Radiology CT Renal Stone Study  Result Date: 09/05/2021 CLINICAL DATA:  Right-sided flank pain and lower back pain with hematuria. EXAM: CT ABDOMEN AND PELVIS WITHOUT CONTRAST TECHNIQUE: Multidetector CT imaging of the abdomen and pelvis was performed following the standard protocol without IV contrast. RADIATION DOSE REDUCTION: This exam was performed according to the departmental dose-optimization program which includes automated exposure control, adjustment of the mA and/or kV according to patient size and/or use of iterative reconstruction technique.  COMPARISON:  CT abdomen pelvis October 30, 2017 FINDINGS: Lower chest: No acute abnormality. Hepatobiliary: Diffuse hepatic steatosis with focal fatty sparing along the gallbladder fossa. Gallbladder is unremarkable. No biliary ductal dilation. Pancreas: No pancreatic ductal dilation or evidence of acute inflammation. Spleen: No splenomegaly. Adrenals/Urinary Tract: Bilateral adrenal glands appear normal. No hydronephrosis. No renal, ureteral or bladder calculi. Urinary bladder is unremarkable  for degree of distension. Stomach/Bowel: No radiopaque enteric contrast material was administered. Stomach is unremarkable for degree of distension. No pathologic dilation of small or large bowel. The appendix appears normal. Mild fibrofatty infiltration of the terminal ileum without evidence of acute inflammation. Vascular/Lymphatic: Normal caliber abdominal aorta. Prominent right lower quadrant lymph nodes measure up to 6 mm. Reproductive: Prostate is unremarkable. Other: No significant abdominopelvic free fluid. Musculoskeletal: No acute osseous abnormality. IMPRESSION: 1. No acute abnormality in the abdomen or pelvis. Specifically normal appendix and no evidence of obstructive uropathy. 2. 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 3. Prominent right lower quadrant lymph nodes measure up to 6 mm, favored reactive etiology. Electronically Signed   By: Maudry Mayhew M.D.   On: 09/05/2021 12:08    Procedures Procedures    Medications Ordered in ED Medications - No data to display  ED Course/ Medical Decision Making/ A&P Clinical Course as of 09/05/21 1237  Fri Sep 05, 2021  1220 Patient CT scan is negative for kidney stones and shows a normal appendix.  He does have reactive lymphadenopathy in the right lower quadrant, unknown etiology. Urine  reviewed and interpreted by myself shows no signs of infection. Does have proteinuria but no signs on exam of edema concerning for nephritic or nephrotic syndrome.  [VK]  1229 Patient reports that his pain has remained well controlled.  Heart rate has normalized.  Discussed with patient it is possible that he may have passed a stone but that he should follow-up with urology should he continue to have hematuria.  He is given strict return precautions. [VK]    Clinical Course User Index [VK] Phoebe Sharps, DO                           Medical Decision Making [name] with past medical history of autism and prior kidney stones presenting to the emergency department with hematuria and back pain.  Patient had no trauma or falls making traumatic injury unlikely cause of his symptoms.  He has no focal neurologic deficits making spinal cord injury unlikely.  The patient will have urine and CT performed to evaluate for UTI, pyelonephritis or nephrolithiasis as causes of his pain.  He is declining any pain or nausea medication at this time and will be reassessed.  Amount and/or Complexity of Data Reviewed External Data Reviewed: notes.    Details: Prior kidney stone was in 2019, was 3 mm without obstruction and he was able to pass on its own Labs: ordered. Decision-making details documented in ED Course. Radiology: ordered. Decision-making details documented in ED Course.          Final Clinical Impression(s) / ED Diagnoses Final diagnoses:  Gross hematuria    Rx / DC Orders ED Discharge Orders          Ordered    ondansetron (ZOFRAN) 4 MG tablet  Every 6 hours        09/05/21 1234              [place], Whigham K, DO 09/05/21 1237

## 2021-09-05 NOTE — ED Triage Notes (Signed)
Pt presents from PCP for kidney stone r/o, has c/o of lower back pain with hematuria, PMH kidney stones.

## 2021-09-17 ENCOUNTER — Ambulatory Visit (INDEPENDENT_AMBULATORY_CARE_PROVIDER_SITE_OTHER): Payer: Medicaid Other | Admitting: Physician Assistant

## 2021-09-17 VITALS — BP 122/83 | HR 137

## 2021-09-17 DIAGNOSIS — Z87442 Personal history of urinary calculi: Secondary | ICD-10-CM | POA: Diagnosis not present

## 2021-09-17 DIAGNOSIS — R8271 Bacteriuria: Secondary | ICD-10-CM

## 2021-09-17 DIAGNOSIS — R3129 Other microscopic hematuria: Secondary | ICD-10-CM

## 2021-09-17 NOTE — Progress Notes (Unsigned)
09/17/2021 10:29 AM   Joshua Duffy 1995-11-15 [id number]   Assessment:  1. Microscopic hematuria - Urinalysis, Routine w reflex microscopic  2. Personal history of kidney stones  3. Bacteriuria - Urine Culture  Plan: Pt will FU for cysto to complete hematuria eval. Urine cx. No tx unless indicated by results. Pt already on antibx for GI issues. Will    Chief Complaint: No chief complaint on file.   Referring provider: Assunta Found, MD [address] [place],  [place] [address]   History of Present Illness:  Joshua Duffy is a 26 y.o. year old male with history of urolithiasis who is seen in consultation from Assunta Found, MD for evaluation of gross hematuria.  Patient was evaluated in the emergency department on 09/05/2021 for complaint of right-sided low back pain and gross hematuria and dysuria.  History Pt drinks several Sundrop sodas daily, cutting down since recent dx of T2DM. Pt currently with minimal sxs. CT stone study on 8/4 reveals no evidence of obstructive uropathy or stone disease.  Inflammatory changes noted in the ileum consistent with ileitis or Crohn's disease.  Right lower quadrant lymph nodes also noted. Pt now on antibx Rx by PCP for this. Most recent stone event was 10/20/2017.  UA=0-5 WBC, calcium oxylate crystals seen, few bacteria, nitrite negative IPSS=1, QOL=1  ED eval: 09/05/2021 [name] with a past medical history of autism and kidney stones presenting to the emergency department with back pain and hematuria.  The patient states that yesterday around 5:00 PM he woke up from a nap with some right lower back pain.  He states that he did not have any previous trauma or falls, recent heavy lifting or changes in activity.  He states that this morning he started to notice blood in his urine.  He states he was seen at his primary doctor who recommended that he come to the emergency department to be evaluated for kidney stones.  He  states he has mild dysuria and lower abdominal pain.  He denies any fevers or chills.  He states he has some nausea but denies any vomiting.  He denies any numbness or weakness in his arms or legs.  He denies any saddle anesthesia loss of bowel or bladder control.  He states he has had 1 kidney stone in the past that he was able to pass on his own.  Portions of the above documentation were copied from a prior visit for review purposes only  Past Medical History:  Past Medical History:  Diagnosis Date   Anxiety    Asperger syndrome    Attention deficit disorder    Headache(784.0)     Past Surgical History:  Past Surgical History:  Procedure Laterality Date   CIRCUMCISION      Allergies:  Allergies  Allergen Reactions   Codeine Nausea And Vomiting    Family History:  Family History  Problem Relation Age of Onset   Headache Mother    Headache Maternal Grandmother    Seizures Paternal Grandfather    Headache Other     Social History:  Social History   Tobacco Use   Smoking status: Never   Smokeless tobacco: Never  Vaping Use   Vaping Use: Never used  Substance Use Topics   Alcohol use: No   Drug use: No    Review of symptoms:  Constitutional:  Negative for unexplained weight loss, night sweats, fever, chills ENT:  Negative for nose bleeds, sinus pain, painful  swallowing CV:  Negative for chest pain, shortness of breath, exercise intolerance, palpitations, loss of consciousness Resp:  Negative for cough, wheezing, shortness of breath GI:  Negative for nausea, vomiting, diarrhea, bloody stools GU:  Positives noted in HPI Neuro:  Negative for seizures, poor balance, limb weakness, slurred speech Psych:  Negative for lack of energy, depression, anxiety Endocrine:  Negative for polydipsia, polyuria, symptoms of hypoglycemia (dizziness, hunger, sweating) Hematologic:  Negative for anemia, purpura, petechia, prolonged or excessive bleeding, use of anticoagulants    Physical Exam: BP 122/83   Pulse (!) 137   Constitutional:  Alert and oriented, No acute distress. HEENT: NCAT, moist mucus membranes.  Trachea midline, no masses. Cardiovascular: Regular rate and rhythm without murmur, rub, or gallops No clubbing, cyanosis, or edema. Respiratory: Normal respiratory effort, clear to auscultation bilaterally GI: Abdomen is soft, nontender, nondistended, no abdominal masses BACK:  Non-tender to palpation.  No CVAT Lymph: No cervical or inguinal lymphadenopathy. Skin: No obvious rashes, warm, dry, intact Neurologic: Alert and oriented, Cranial nerves grossly intact, no focal deficits, moving all 4 extremities***. Psychiatric: Appropriate. Normal mood and affect.  Laboratory Data: No results found for this or any previous visit (from the past 24 hour(s)).  Lab Results  Component Value Date   WBC 11.2 (H) 02/11/2018   HGB 16.4 02/11/2018   HCT 48.9 02/11/2018   MCV 87.9 02/11/2018   PLT 239 02/11/2018    Lab Results  Component Value Date   CREATININE 0.88 02/11/2018    No results found for: "HGBA1C"  Urinalysis    Component Value Date/Time   COLORURINE AMBER (A) 09/05/2021 1115   APPEARANCEUR CLOUDY (A) 09/05/2021 1115   LABSPEC 1.029 09/05/2021 1115   PHURINE 6.0 09/05/2021 1115   GLUCOSEU 50 (A) 09/05/2021 1115   HGBUR LARGE (A) 09/05/2021 1115   BILIRUBINUR NEGATIVE 09/05/2021 1115   KETONESUR 5 (A) 09/05/2021 1115   PROTEINUR >=300 (A) 09/05/2021 1115   NITRITE NEGATIVE 09/05/2021 1115   LEUKOCYTESUR NEGATIVE 09/05/2021 1115    Lab Results  Component Value Date   BACTERIA NONE SEEN 09/05/2021    Pertinent Imaging: Results for orders placed during the hospital encounter of 04/20/04  DG Abd 1 View  Narrative Clinical data:   Abdominal pain; diarrhea; anorexia ABDOMEN - 1 VIEW: The bowel gas pattern is unremarkable.  Two rounded radiolucencies are seen within the right abdomen overlying the ascending colon, each  measuring approximately 1.0 cm.  It is uncertain whether this merely represent unusual stool in the right colon, with other unusual considerations including colonic lipomas or less likely cholestrol gallstones.  If clinically indicated, ultrasound or CT could be performed for further evaluation. IMPRESSION: 1.  No dilated bowel loops. 2.  Unusual radiolucencies seen overlying the right colon, which are of uncertain etiology as discussed above.  Ultrasound or CT could be performed for further evaluation if clinically warranted.  Provider: Ladene Artist  No results found for this or any previous visit.     [place], Regan Rakers, PA-C [facility]

## 2021-09-18 LAB — URINALYSIS, ROUTINE W REFLEX MICROSCOPIC
Bilirubin, UA: NEGATIVE
Glucose, UA: NEGATIVE
Ketones, UA: NEGATIVE
Leukocytes,UA: NEGATIVE
Nitrite, UA: NEGATIVE
RBC, UA: NEGATIVE
Specific Gravity, UA: >1.03 — ABNORMAL HIGH (ref 1.005–1.030)
Urobilinogen, Ur: 0.2 mg/dL (ref 0.2–1.0)
pH, UA: 5 (ref 5.0–7.5)

## 2021-09-18 LAB — MICROSCOPIC EXAMINATION

## 2021-09-19 LAB — URINE CULTURE: Organism ID, Bacteria: NO GROWTH

## 2021-09-21 ENCOUNTER — Other Ambulatory Visit: Payer: Self-pay | Admitting: Physician Assistant

## 2021-09-27 LAB — LITHOLINK 24HR URINE PANEL
Ammonium, Urine: 61 mmol/24 hr — ABNORMAL HIGH (ref 15–60)
Calcium Oxalate Saturation: 11.92 — ABNORMAL HIGH (ref 6.00–10.00)
Calcium Phosphate Saturation: 2.25 — ABNORMAL HIGH (ref 0.50–2.00)
Calcium, Urine: 348 mg/24 hr — ABNORMAL HIGH (ref <250)
Calcium/Creatinine Ratio: 172 mg/g creat (ref 34–196)
Calcium/Kg Body Weight: 4 mg/24 hr/kg (ref <4.0)
Chloride, Urine: 198 mmol/24 hr (ref 70–250)
Citrate, Urine: 958 mg/24 hr (ref >450)
Creatinine, Urine: 2021 mg/24 hr
Creatinine/Kg Body Weight: 23.1 mg/24 hr/kg (ref 11.9–24.4)
Cystine, Urine, Qualitative: NEGATIVE
Magnesium, Urine: 83 mg/24 hr (ref 30–120)
Oxalate, Urine: 27 mg/24 hr (ref 20–40)
Phosphorus, Urine: 1152 mg/24 hr (ref 600–1200)
Potassium, Urine: 39 mmol/24 hr (ref 20–100)
Protein Catabolic Rate: 1 g/kg/24 hr (ref 0.8–1.4)
Sodium, Urine: 183 mmol/24 hr — ABNORMAL HIGH (ref 50–150)
Sulfate, Urine: 30 meq/24 hr (ref 20–80)
Urea Nitrogen, Urine: 12.06 g/24 hr (ref 6.00–14.00)
Uric Acid Saturation: 3.26 — ABNORMAL HIGH (ref <1.00)
Uric Acid, Urine: 1000 mg/24 hr — ABNORMAL HIGH (ref <800)
Urine Volume (Preserved): 1070 mL/24 hr (ref 500–4000)
pH, 24 hr, Urine: 5.667 — ABNORMAL LOW (ref 5.800–6.200)

## 2021-10-13 ENCOUNTER — Ambulatory Visit (INDEPENDENT_AMBULATORY_CARE_PROVIDER_SITE_OTHER): Payer: Medicaid Other | Admitting: Urology

## 2021-10-13 VITALS — BP 111/77 | HR 96

## 2021-10-13 DIAGNOSIS — Z87442 Personal history of urinary calculi: Secondary | ICD-10-CM | POA: Diagnosis not present

## 2021-10-13 DIAGNOSIS — R3129 Other microscopic hematuria: Secondary | ICD-10-CM

## 2021-10-13 LAB — URINALYSIS, ROUTINE W REFLEX MICROSCOPIC
Bilirubin, UA: NEGATIVE
Glucose, UA: NEGATIVE
Ketones, UA: NEGATIVE
Leukocytes,UA: NEGATIVE
Nitrite, UA: NEGATIVE
Protein,UA: NEGATIVE
RBC, UA: NEGATIVE
Specific Gravity, UA: 1.025 (ref 1.005–1.030)
Urobilinogen, Ur: 0.2 mg/dL (ref 0.2–1.0)
pH, UA: 5.5 (ref 5.0–7.5)

## 2021-10-13 MED ORDER — CIPROFLOXACIN HCL 500 MG PO TABS
500.0000 mg | ORAL_TABLET | Freq: Once | ORAL | Status: AC
  Administered 2021-10-13: 500 mg via ORAL

## 2021-10-13 NOTE — Patient Instructions (Signed)

## 2021-10-13 NOTE — Progress Notes (Signed)
10/13/2021 9:26 AM   [name] 1995/08/04 [id number]  Referring provider: Assunta Found, MD [address] [place],  [place] [address]  Followup nephrolithiasis   HPI: Mr Joshua Duffy is a 26yo here for followup for nephrolithiasis and microhematuria. UA today is normal. CT from 09/05/2021 shows no renal or ureteral calculi. NO flank pain currently. No significant LUTS.    PMH: Past Medical History:  Diagnosis Date   Anxiety    Asperger syndrome    Attention deficit disorder    Headache(784.0)     Surgical History: Past Surgical History:  Procedure Laterality Date   CIRCUMCISION      Home Medications:  Allergies as of 10/13/2021       Reactions   Codeine Nausea And Vomiting        Medication List        Accurate as of October 13, 2021  9:26 AM. If you have any questions, ask your nurse or doctor.          albuterol 108 (90 Base) MCG/ACT inhaler Commonly known as: VENTOLIN HFA Inhale 1-2 puffs into the lungs every 6 (six) hours as needed for wheezing or shortness of breath.   cloNIDine 0.2 MG tablet Commonly known as: CATAPRES Take 0.2 mg by mouth at bedtime.   cloNIDine 0.1 MG tablet Commonly known as: CATAPRES Take 0.1 mg by mouth 2 (two) times daily.   fenofibrate 145 MG tablet Commonly known as: TRICOR Take 145 mg by mouth daily.   fluticasone 50 MCG/ACT nasal spray Commonly known as: FLONASE Place 2 sprays into both nostrils daily as needed for allergies or rhinitis.   GuanFACINE HCl 3 MG Tb24 Take 1 tablet by mouth every morning.   hydrOXYzine 25 MG tablet Commonly known as: ATARAX Take 25 mg by mouth daily as needed for anxiety.   ibuprofen 200 MG tablet Commonly known as: ADVIL Take 200-800 mg by mouth every 6 (six) hours as needed for headache.   levocetirizine 5 MG tablet Commonly known as: XYZAL Take 5 mg by mouth at bedtime.   lisdexamfetamine 20 MG capsule Commonly known as: VYVANSE Take 20 mg by mouth every  morning.   lisinopril 10 MG tablet Commonly known as: ZESTRIL Take 10 mg by mouth daily.   metFORMIN 500 MG tablet Commonly known as: GLUCOPHAGE Take 500 mg by mouth 2 (two) times daily with a meal.   montelukast 5 MG chewable tablet Commonly known as: SINGULAIR Chew 5 mg by mouth at bedtime.   neomycin-polymyxin-hydrocortisone 3.5-10000-1 OTIC suspension Commonly known as: CORTISPORIN Place 4 drops into the left ear 3 (three) times daily.   omeprazole 20 MG capsule Commonly known as: PRILOSEC Take 20 mg by mouth at bedtime.   ondansetron 4 MG tablet Commonly known as: ZOFRAN Take 1 tablet (4 mg total) by mouth every 6 (six) hours.   PREDNISONE PO Take by mouth See admin instructions. Take 5 tablets daily for 3 days, then 4 tabs daily for 3 days, then 3 tabs daily for 3 days, then 2 tabs daily for 3 days, then 1 tab daily for 3 days, then STOP   rosuvastatin 10 MG tablet Commonly known as: CRESTOR Take 10 mg by mouth daily.   Tussionex Pennkinetic ER 10-8 MG/5ML Suer Generic drug: chlorpheniramine-HYDROcodone Take 5 mLs by mouth every 12 (twelve) hours as needed for cough.        Allergies:  Allergies  Allergen Reactions   Codeine Nausea And Vomiting    Family History: Family  History  Problem Relation Age of Onset   Headache Mother    Headache Maternal Grandmother    Seizures Paternal Grandfather    Headache Other     Social History:  reports that he has never smoked. He has never used smokeless tobacco. He reports that he does not drink alcohol and does not use drugs.  ROS: All other review of systems were reviewed and are negative except what is noted above in HPI  Physical Exam: BP 111/77   Pulse 96   Constitutional:  Alert and oriented, No acute distress. HEENT: [place] AT, moist mucus membranes.  Trachea midline, no masses. Cardiovascular: No clubbing, cyanosis, or edema. Respiratory: Normal respiratory effort, no increased work of breathing. GI:  Abdomen is soft, nontender, nondistended, no abdominal masses GU: No CVA tenderness.  Lymph: No cervical or inguinal lymphadenopathy. Skin: No rashes, bruises or suspicious lesions. Neurologic: Grossly intact, no focal deficits, moving all 4 extremities. Psychiatric: Normal mood and affect.  Laboratory Data: Lab Results  Component Value Date   WBC 11.2 (H) 02/11/2018   HGB 16.4 02/11/2018   HCT 48.9 02/11/2018   MCV 87.9 02/11/2018   PLT 239 02/11/2018    Lab Results  Component Value Date   CREATININE 0.88 02/11/2018    No results found for: "PSA"  No results found for: "TESTOSTERONE"  No results found for: "HGBA1C"  Urinalysis    Component Value Date/Time   COLORURINE AMBER (A) 09/05/2021 1115   APPEARANCEUR Hazy (A) 09/17/2021 1156   LABSPEC 1.029 09/05/2021 1115   PHURINE 6.0 09/05/2021 1115   GLUCOSEU Negative 09/17/2021 1156   HGBUR LARGE (A) 09/05/2021 1115   BILIRUBINUR Negative 09/17/2021 1156   KETONESUR 5 (A) 09/05/2021 1115   PROTEINUR Trace (A) 09/17/2021 1156   PROTEINUR >=300 (A) 09/05/2021 1115   NITRITE Negative 09/17/2021 1156   NITRITE NEGATIVE 09/05/2021 1115   LEUKOCYTESUR Negative 09/17/2021 1156   LEUKOCYTESUR NEGATIVE 09/05/2021 1115    Lab Results  Component Value Date   LABMICR See below: 09/17/2021   WBCUA 0-5 09/17/2021   LABEPIT 0-10 09/17/2021   MUCUS Present (A) 09/17/2021   BACTERIA Few 09/17/2021    Pertinent Imaging: CT 09/05/2021: Images reviewed and discussed with the patient Results for orders placed during the hospital encounter of 04/20/04  DG Abd 1 View  Narrative Clinical data:   Abdominal pain; diarrhea; anorexia ABDOMEN - 1 VIEW: The bowel gas pattern is unremarkable.  Two rounded radiolucencies are seen within the right abdomen overlying the ascending colon, each measuring approximately 1.0 cm.  It is uncertain whether this merely represent unusual stool in the right colon, with other unusual considerations  including colonic lipomas or less likely cholestrol gallstones.  If clinically indicated, ultrasound or CT could be performed for further evaluation. IMPRESSION: 1.  No dilated bowel loops. 2.  Unusual radiolucencies seen overlying the right colon, which are of uncertain etiology as discussed above.  Ultrasound or CT could be performed for further evaluation if clinically warranted.  Provider: Ladene Artist  No results found for this or any previous visit.  No results found for this or any previous visit.  No results found for this or any previous visit.  No results found for this or any previous visit.  No results found for this or any previous visit.  No results found for this or any previous visit.  Results for orders placed during the hospital encounter of 09/05/21  CT Renal Stone Study  Narrative CLINICAL DATA:  Right-sided flank pain and lower back pain with hematuria.  EXAM: CT ABDOMEN AND PELVIS WITHOUT CONTRAST  TECHNIQUE: Multidetector CT imaging of the abdomen and pelvis was performed following the standard protocol without IV contrast.  RADIATION DOSE REDUCTION: This exam was performed according to the departmental dose-optimization program which includes automated exposure control, adjustment of the mA and/or kV according to patient size and/or use of iterative reconstruction technique.  COMPARISON:  CT abdomen pelvis October 30, 2017  FINDINGS: Lower chest: No acute abnormality.  Hepatobiliary: Diffuse hepatic steatosis with focal fatty sparing along the gallbladder fossa. Gallbladder is unremarkable. No biliary ductal dilation.  Pancreas: No pancreatic ductal dilation or evidence of acute inflammation.  Spleen: No splenomegaly.  Adrenals/Urinary Tract: Bilateral adrenal glands appear normal. No hydronephrosis. No renal, ureteral or bladder calculi. Urinary bladder is unremarkable for degree of distension.  Stomach/Bowel: No radiopaque enteric  contrast material was administered. Stomach is unremarkable for degree of distension. No pathologic dilation of small or large bowel. The appendix appears normal. Mild fibrofatty infiltration of the terminal ileum without evidence of acute inflammation.  Vascular/Lymphatic: Normal caliber abdominal aorta. Prominent right lower quadrant lymph nodes measure up to 6 mm.  Reproductive: Prostate is unremarkable.  Other: No significant abdominopelvic free fluid.  Musculoskeletal: No acute osseous abnormality.  IMPRESSION: 1. No acute abnormality in the abdomen or pelvis. Specifically normal appendix and no evidence of obstructive uropathy. 2. 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 3. Prominent right lower quadrant lymph nodes measure up to 6 mm, favored reactive etiology.   Electronically Signed By: Maudry Mayhew M.D. On: 09/05/2021 12:08   Assessment & Plan:    1. Hx of nephrolithiasis -Increase water intake, decrease salt intake    No follow-ups on file.  Wilkie Aye, MD  [facility]      HPI:  Blood pressure 111/77, pulse 96. NED. A&Ox3.   No respiratory distress   Abd soft, NT, ND Normal phallus with bilateral descended testicles  Cystoscopy Procedure Note  Patient identification was confirmed, informed consent was obtained, and patient was prepped using Betadine solution.  Lidocaine jelly was administered per urethral meatus.     Pre-Procedure: - Inspection reveals a normal caliber ureteral meatus.  Procedure: The flexible cystoscope was introduced without difficulty - No urethral strictures/lesions are present. - Normal prostate  - Normal bladder neck - Bilateral ureteral orifices identified - Bladder mucosa  reveals no ulcers, tumors, or lesions -  No bladder stones - No trabeculation    Post-Procedure: - Patient tolerated the procedure well  Assessment/ Plan:   No follow-ups on file.  Wilkie Aye, MD

## 2021-10-21 ENCOUNTER — Encounter: Payer: Self-pay | Admitting: Urology

## 2022-04-13 ENCOUNTER — Ambulatory Visit (INDEPENDENT_AMBULATORY_CARE_PROVIDER_SITE_OTHER): Payer: Medicaid Other | Admitting: Urology

## 2022-04-13 ENCOUNTER — Encounter: Payer: Self-pay | Admitting: Urology

## 2022-04-13 VITALS — BP 127/78 | HR 82

## 2022-04-13 DIAGNOSIS — Z87442 Personal history of urinary calculi: Secondary | ICD-10-CM | POA: Diagnosis not present

## 2022-04-13 DIAGNOSIS — R3129 Other microscopic hematuria: Secondary | ICD-10-CM

## 2022-04-13 LAB — URINALYSIS, ROUTINE W REFLEX MICROSCOPIC
Bilirubin, UA: NEGATIVE
Glucose, UA: NEGATIVE
Ketones, UA: NEGATIVE
Leukocytes,UA: NEGATIVE
Nitrite, UA: NEGATIVE
Protein,UA: NEGATIVE
RBC, UA: NEGATIVE
Specific Gravity, UA: 1.025 (ref 1.005–1.030)
Urobilinogen, Ur: 0.2 mg/dL (ref 0.2–1.0)
pH, UA: 5 (ref 5.0–7.5)

## 2022-04-13 NOTE — Progress Notes (Signed)
04/13/2022 8:51 AM   Vonzella Nipple [birth year] PO:[id number]  Referring provider: Sharilyn Sites, MD [address] [place],  [place] [address]  No chief complaint on file.   HPI:    PMH: Past Medical History:  Diagnosis Date   Anxiety    Asperger syndrome    Attention deficit disorder    Headache(784.0)     Surgical History: Past Surgical History:  Procedure Laterality Date   CIRCUMCISION      Home Medications:  Allergies as of 04/13/2022       Reactions   Codeine Nausea And Vomiting        Medication List        Accurate as of April 13, 2022  8:51 AM. If you have any questions, ask your nurse or doctor.          albuterol 108 (90 Base) MCG/ACT inhaler Commonly known as: VENTOLIN HFA Inhale 1-2 puffs into the lungs every 6 (six) hours as needed for wheezing or shortness of breath.   cloNIDine 0.2 MG tablet Commonly known as: CATAPRES Take 0.2 mg by mouth at bedtime.   cloNIDine 0.1 MG tablet Commonly known as: CATAPRES Take 0.1 mg by mouth 2 (two) times daily.   fenofibrate 145 MG tablet Commonly known as: TRICOR Take 145 mg by mouth daily.   fluticasone 50 MCG/ACT nasal spray Commonly known as: FLONASE Place 2 sprays into both nostrils daily as needed for allergies or rhinitis.   GuanFACINE HCl 3 MG Tb24 Take 1 tablet by mouth every morning.   hydrOXYzine 25 MG tablet Commonly known as: ATARAX Take 25 mg by mouth daily as needed for anxiety.   ibuprofen 200 MG tablet Commonly known as: ADVIL Take 200-800 mg by mouth every 6 (six) hours as needed for headache.   levocetirizine 5 MG tablet Commonly known as: XYZAL Take 5 mg by mouth at bedtime.   lisdexamfetamine 20 MG capsule Commonly known as: VYVANSE Take 20 mg by mouth every morning.   lisinopril 10 MG tablet Commonly known as: ZESTRIL Take 10 mg by mouth daily.   metFORMIN 500 MG tablet Commonly known as: GLUCOPHAGE Take 500 mg by mouth 2 (two) times daily with a  meal.   montelukast 5 MG chewable tablet Commonly known as: SINGULAIR Chew 5 mg by mouth at bedtime.   neomycin-polymyxin-hydrocortisone 3.5-10000-1 OTIC suspension Commonly known as: CORTISPORIN Place 4 drops into the left ear 3 (three) times daily.   omeprazole 20 MG capsule Commonly known as: PRILOSEC Take 20 mg by mouth at bedtime.   ondansetron 4 MG tablet Commonly known as: ZOFRAN Take 1 tablet (4 mg total) by mouth every 6 (six) hours.   PREDNISONE PO Take by mouth See admin instructions. Take 5 tablets daily for 3 days, then 4 tabs daily for 3 days, then 3 tabs daily for 3 days, then 2 tabs daily for 3 days, then 1 tab daily for 3 days, then STOP   rosuvastatin 10 MG tablet Commonly known as: CRESTOR Take 10 mg by mouth daily.   Tussionex Pennkinetic ER 10-8 MG/5ML Suer Generic drug: chlorpheniramine-HYDROcodone Take 5 mLs by mouth every 12 (twelve) hours as needed for cough.        Allergies:  Allergies  Allergen Reactions   Codeine Nausea And Vomiting    Family History: Family History  Problem Relation Age of Onset   Headache Mother    Headache Maternal Grandmother    Seizures Paternal Grandfather    Headache Other  Social History:  reports that he has never smoked. He has never used smokeless tobacco. He reports that he does not drink alcohol and does not use drugs.  ROS: All other review of systems were reviewed and are negative except what is noted above in HPI  Physical Exam: BP 127/78   Pulse 82   Constitutional:  Alert and oriented, No acute distress. HEENT: [place] AT, moist mucus membranes.  Trachea midline, no masses. Cardiovascular: No clubbing, cyanosis, or edema. Respiratory: Normal respiratory effort, no increased work of breathing. GI: Abdomen is soft, nontender, nondistended, no abdominal masses GU: No CVA tenderness.  Lymph: No cervical or inguinal lymphadenopathy. Skin: No rashes, bruises or suspicious lesions. Neurologic:  Grossly intact, no focal deficits, moving all 4 extremities. Psychiatric: Normal mood and affect.  Laboratory Data: Lab Results  Component Value Date   WBC 11.2 (H) 02/11/2018   HGB 16.4 02/11/2018   HCT 48.9 02/11/2018   MCV 87.9 02/11/2018   PLT 239 02/11/2018    Lab Results  Component Value Date   CREATININE 0.88 02/11/2018    No results found for: "PSA"  No results found for: "TESTOSTERONE"  No results found for: "HGBA1C"  Urinalysis    Component Value Date/Time   COLORURINE AMBER (A) 09/05/2021 1115   APPEARANCEUR Clear 10/13/2021 1009   LABSPEC 1.029 09/05/2021 1115   PHURINE 6.0 09/05/2021 1115   GLUCOSEU Negative 10/13/2021 1009   HGBUR LARGE (A) 09/05/2021 1115   BILIRUBINUR Negative 10/13/2021 1009   KETONESUR 5 (A) 09/05/2021 1115   PROTEINUR Negative 10/13/2021 1009   PROTEINUR >=300 (A) 09/05/2021 1115   NITRITE Negative 10/13/2021 1009   NITRITE NEGATIVE 09/05/2021 1115   LEUKOCYTESUR Negative 10/13/2021 1009   LEUKOCYTESUR NEGATIVE 09/05/2021 1115    Lab Results  Component Value Date   LABMICR Comment 10/13/2021   WBCUA 0-5 09/17/2021   LABEPIT 0-10 09/17/2021   MUCUS Present (A) 09/17/2021   BACTERIA Few 09/17/2021    Pertinent Imaging: *** Results for orders placed during the hospital encounter of 04/20/04  DG Abd 1 View  Narrative Clinical data:   Abdominal pain; diarrhea; anorexia ABDOMEN - 1 VIEW: The bowel gas pattern is unremarkable.  Two rounded radiolucencies are seen within the right abdomen overlying the ascending colon, each measuring approximately 1.0 cm.  It is uncertain whether this merely represent unusual stool in the right colon, with other unusual considerations including colonic lipomas or less likely cholestrol gallstones.  If clinically indicated, ultrasound or CT could be performed for further evaluation. IMPRESSION: 1.  No dilated bowel loops. 2.  Unusual radiolucencies seen overlying the right colon, which are of  uncertain etiology as discussed above.  Ultrasound or CT could be performed for further evaluation if clinically warranted.  Provider: Jeanene Erb  No results found for this or any previous visit.  No results found for this or any previous visit.  No results found for this or any previous visit.  No results found for this or any previous visit.  No valid procedures specified. No results found for this or any previous visit.  Results for orders placed during the hospital encounter of 09/05/21  CT Renal Stone Study  Narrative CLINICAL DATA:  Right-sided flank pain and lower back pain with hematuria.  EXAM: CT ABDOMEN AND PELVIS WITHOUT CONTRAST  TECHNIQUE: Multidetector CT imaging of the abdomen and pelvis was performed following the standard protocol without IV contrast.  RADIATION DOSE REDUCTION: This exam was performed according to the departmental dose-optimization  program which includes automated exposure control, adjustment of the mA and/or kV according to patient size and/or use of iterative reconstruction technique.  COMPARISON:  CT abdomen pelvis October 30, 2017  FINDINGS: Lower chest: No acute abnormality.  Hepatobiliary: Diffuse hepatic steatosis with focal fatty sparing along the gallbladder fossa. Gallbladder is unremarkable. No biliary ductal dilation.  Pancreas: No pancreatic ductal dilation or evidence of acute inflammation.  Spleen: No splenomegaly.  Adrenals/Urinary Tract: Bilateral adrenal glands appear normal. No hydronephrosis. No renal, ureteral or bladder calculi. Urinary bladder is unremarkable for degree of distension.  Stomach/Bowel: No radiopaque enteric contrast material was administered. Stomach is unremarkable for degree of distension. No pathologic dilation of small or large bowel. The appendix appears normal. Mild fibrofatty infiltration of the terminal ileum without evidence of acute inflammation.  Vascular/Lymphatic:  Normal caliber abdominal aorta. Prominent right lower quadrant lymph nodes measure up to 6 mm.  Reproductive: Prostate is unremarkable.  Other: No significant abdominopelvic free fluid.  Musculoskeletal: No acute osseous abnormality.  IMPRESSION: 1. No acute abnormality in the abdomen or pelvis. Specifically normal appendix and no evidence of obstructive uropathy. 2. 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 3. Prominent right lower quadrant lymph nodes measure up to 6 mm, favored reactive etiology.   Electronically Signed By: Dahlia Bailiff M.D. On: 09/05/2021 12:08   Assessment & Plan:    1. Microscopic hematuria -urine clear today. Followup 1 year - Urinalysis, Routine w reflex microscopic  2. Personal history of kidney stones ***   No follow-ups on file.  Nicolette Bang, MD  [facility] [place]

## 2022-04-13 NOTE — Patient Instructions (Signed)

## 2023-03-31 ENCOUNTER — Ambulatory Visit (HOSPITAL_COMMUNITY)
Admission: RE | Admit: 2023-03-31 | Discharge: 2023-03-31 | Disposition: A | Discharge status: Home / Self Care | Payer: MEDICAID | Source: Ambulatory Visit | Attending: Urology | Admitting: Urology

## 2023-03-31 DIAGNOSIS — Z87442 Personal history of urinary calculi: Secondary | ICD-10-CM | POA: Diagnosis present

## 2023-03-31 DIAGNOSIS — R3129 Other microscopic hematuria: Secondary | ICD-10-CM | POA: Insufficient documentation

## 2023-04-05 ENCOUNTER — Ambulatory Visit: Payer: Medicaid Other | Admitting: Urology

## 2023-04-16 ENCOUNTER — Ambulatory Visit: Payer: MEDICAID | Admitting: Urology

## 2023-04-16 VITALS — BP 123/77 | HR 109

## 2023-04-16 DIAGNOSIS — Z87442 Personal history of urinary calculi: Secondary | ICD-10-CM | POA: Diagnosis not present

## 2023-04-16 DIAGNOSIS — R3129 Other microscopic hematuria: Secondary | ICD-10-CM

## 2023-04-16 DIAGNOSIS — N200 Calculus of kidney: Secondary | ICD-10-CM

## 2023-04-16 LAB — URINALYSIS, ROUTINE W REFLEX MICROSCOPIC
Bilirubin, UA: NEGATIVE
Glucose, UA: NEGATIVE
Ketones, UA: NEGATIVE
Leukocytes,UA: NEGATIVE
Nitrite, UA: NEGATIVE
Protein,UA: NEGATIVE
RBC, UA: NEGATIVE
Specific Gravity, UA: 1.03 (ref 1.005–1.030)
Urobilinogen, Ur: 0.2 mg/dL (ref 0.2–1.0)
pH, UA: 6 (ref 5.0–7.5)

## 2023-04-16 NOTE — Progress Notes (Signed)
 04/16/2023 10:13 AM   Joshua Duffy 1995-03-31 [id number]  Referring provider: Assunta Found, MD [address] [place],  [place] [address]  Followup microhematuria   HPI: Mr Joshua Duffy is a 27yo here for followup for microhematuria and nephrolithiasis. No stone events since last visit. He denies any flank pain. Renal US 03/31/23 shows no calculi and no hydronephrosis. No gross hematuria. UA today is normal.    PMH: Past Medical History:  Diagnosis Date   Anxiety    Asperger syndrome    Attention deficit disorder    Headache(784.0)     Surgical History: Past Surgical History:  Procedure Laterality Date   CIRCUMCISION      Home Medications:  Allergies as of 04/16/2023       Reactions   Codeine Nausea And Vomiting        Medication List        Accurate as of April 16, 2023 10:13 AM. If you have any questions, ask your nurse or doctor.          albuterol 108 (90 Base) MCG/ACT inhaler Commonly known as: VENTOLIN HFA Inhale 1-2 puffs into the lungs every 6 (six) hours as needed for wheezing or shortness of breath.   cloNIDine 0.2 MG tablet Commonly known as: CATAPRES Take 0.2 mg by mouth at bedtime.   cloNIDine 0.1 MG tablet Commonly known as: CATAPRES Take 0.1 mg by mouth 2 (two) times daily.   fenofibrate 145 MG tablet Commonly known as: TRICOR Take 145 mg by mouth daily.   fluticasone 50 MCG/ACT nasal spray Commonly known as: FLONASE Place 2 sprays into both nostrils daily as needed for allergies or rhinitis.   GuanFACINE HCl 3 MG Tb24 Take 1 tablet by mouth every morning.   hydrOXYzine 25 MG tablet Commonly known as: ATARAX Take 25 mg by mouth daily as needed for anxiety.   ibuprofen 200 MG tablet Commonly known as: ADVIL Take 200-800 mg by mouth every 6 (six) hours as needed for headache.   levocetirizine 5 MG tablet Commonly known as: XYZAL Take 5 mg by mouth at bedtime.   lisdexamfetamine 20 MG capsule Commonly known as:  VYVANSE Take 20 mg by mouth every morning.   lisinopril 10 MG tablet Commonly known as: ZESTRIL Take 10 mg by mouth daily.   metFORMIN 500 MG tablet Commonly known as: GLUCOPHAGE Take 500 mg by mouth 2 (two) times daily with a meal.   montelukast 5 MG chewable tablet Commonly known as: SINGULAIR Chew 5 mg by mouth at bedtime.   neomycin-polymyxin-hydrocortisone 3.5-10000-1 OTIC suspension Commonly known as: CORTISPORIN Place 4 drops into the left ear 3 (three) times daily.   omeprazole 20 MG capsule Commonly known as: PRILOSEC Take 20 mg by mouth at bedtime.   ondansetron 4 MG tablet Commonly known as: ZOFRAN Take 1 tablet (4 mg total) by mouth every 6 (six) hours.   PREDNISONE PO Take by mouth See admin instructions. Take 5 tablets daily for 3 days, then 4 tabs daily for 3 days, then 3 tabs daily for 3 days, then 2 tabs daily for 3 days, then 1 tab daily for 3 days, then STOP   rosuvastatin 10 MG tablet Commonly known as: CRESTOR Take 10 mg by mouth daily.   Tussionex Pennkinetic ER 10-8 MG/5ML Suer Generic drug: chlorpheniramine-HYDROcodone Take 5 mLs by mouth every 12 (twelve) hours as needed for cough.        Allergies:  Allergies  Allergen Reactions   Codeine Nausea And Vomiting  Family History: Family History  Problem Relation Age of Onset   Headache Mother    Headache Maternal Grandmother    Seizures Paternal Grandfather    Headache Other     Social History:  reports that he has never smoked. He has never used smokeless tobacco. He reports that he does not drink alcohol and does not use drugs.  ROS: All other review of systems were reviewed and are negative except what is noted above in HPI  Physical Exam: BP 123/77   Pulse (!) 109   Constitutional:  Alert and oriented, No acute distress. HEENT: [place] AT, moist mucus membranes.  Trachea midline, no masses. Cardiovascular: No clubbing, cyanosis, or edema. Respiratory: Normal respiratory effort,  no increased work of breathing. GI: Abdomen is soft, nontender, nondistended, no abdominal masses GU: No CVA tenderness.  Lymph: No cervical or inguinal lymphadenopathy. Skin: No rashes, bruises or suspicious lesions. Neurologic: Grossly intact, no focal deficits, moving all 4 extremities. Psychiatric: Normal mood and affect.  Laboratory Data: Lab Results  Component Value Date   WBC 11.2 (H) 02/11/2018   HGB 16.4 02/11/2018   HCT 48.9 02/11/2018   MCV 87.9 02/11/2018   PLT 239 02/11/2018    Lab Results  Component Value Date   CREATININE 0.88 02/11/2018    No results found for: "PSA"  No results found for: "TESTOSTERONE"  No results found for: "HGBA1C"  Urinalysis    Component Value Date/Time   COLORURINE AMBER (A) 09/05/2021 1115   APPEARANCEUR Clear 04/13/2022 0859   LABSPEC 1.029 09/05/2021 1115   PHURINE 6.0 09/05/2021 1115   GLUCOSEU Negative 04/13/2022 0859   HGBUR LARGE (A) 09/05/2021 1115   BILIRUBINUR Negative 04/13/2022 0859   KETONESUR 5 (A) 09/05/2021 1115   PROTEINUR Negative 04/13/2022 0859   PROTEINUR >=300 (A) 09/05/2021 1115   NITRITE Negative 04/13/2022 0859   NITRITE NEGATIVE 09/05/2021 1115   LEUKOCYTESUR Negative 04/13/2022 0859   LEUKOCYTESUR NEGATIVE 09/05/2021 1115    Lab Results  Component Value Date   LABMICR Comment 04/13/2022   WBCUA 0-5 09/17/2021   LABEPIT 0-10 09/17/2021   MUCUS Present (A) 09/17/2021   BACTERIA Few 09/17/2021    Pertinent Imaging: Renal US 03/31/2023: Images reviewed and discussed with the patient  Results for orders placed during the hospital encounter of 04/20/04  DG Abd 1 View  Narrative Clinical data:   Abdominal pain; diarrhea; anorexia ABDOMEN - 1 VIEW: The bowel gas pattern is unremarkable.  Two rounded radiolucencies are seen within the right abdomen overlying the ascending colon, each measuring approximately 1.0 cm.  It is uncertain whether this merely represent unusual stool in the right colon,  with other unusual considerations including colonic lipomas or less likely cholestrol gallstones.  If clinically indicated, ultrasound or CT could be performed for further evaluation. IMPRESSION: 1.  No dilated bowel loops. 2.  Unusual radiolucencies seen overlying the right colon, which are of uncertain etiology as discussed above.  Ultrasound or CT could be performed for further evaluation if clinically warranted.  Provider: Ladene Artist  No results found for this or any previous visit.  No results found for this or any previous visit.  No results found for this or any previous visit.  Results for orders placed during the hospital encounter of 03/31/23  Ultrasound renal complete  Narrative CLINICAL DATA:  Nephrolithiasis follow-up  EXAM: RENAL / URINARY TRACT ULTRASOUND COMPLETE  COMPARISON:  CT renal stone 09/05/2021  FINDINGS: Right Kidney:  Renal measurements: 11.6 x 6.2 x  5.4 cm = volume: 203.4 mL. Echogenicity within normal limits. No mass or hydronephrosis visualized.  Left Kidney:  Renal measurements: 12.1 x 6.2 x 5.5 cm = volume: 215.8 mL. Echogenicity within normal limits. No mass or hydronephrosis visualized.  Bladder:  Appears normal for degree of bladder distention.  Other:  None.  IMPRESSION: No hydronephrosis.   Electronically Signed By: Annia Belt M.D. On: 04/05/2023 13:07  No results found for this or any previous visit.  No results found for this or any previous visit.  Results for orders placed during the hospital encounter of 09/05/21  CT Renal Stone Study  Narrative CLINICAL DATA:  Right-sided flank pain and lower back pain with hematuria.  EXAM: CT ABDOMEN AND PELVIS WITHOUT CONTRAST  TECHNIQUE: Multidetector CT imaging of the abdomen and pelvis was performed following the standard protocol without IV contrast.  RADIATION DOSE REDUCTION: This exam was performed according to the departmental dose-optimization program  which includes automated exposure control, adjustment of the mA and/or kV according to patient size and/or use of iterative reconstruction technique.  COMPARISON:  CT abdomen pelvis October 30, 2017  FINDINGS: Lower chest: No acute abnormality.  Hepatobiliary: Diffuse hepatic steatosis with focal fatty sparing along the gallbladder fossa. Gallbladder is unremarkable. No biliary ductal dilation.  Pancreas: No pancreatic ductal dilation or evidence of acute inflammation.  Spleen: No splenomegaly.  Adrenals/Urinary Tract: Bilateral adrenal glands appear normal. No hydronephrosis. No renal, ureteral or bladder calculi. Urinary bladder is unremarkable for degree of distension.  Stomach/Bowel: No radiopaque enteric contrast material was administered. Stomach is unremarkable for degree of distension. No pathologic dilation of small or large bowel. The appendix appears normal. Mild fibrofatty infiltration of the terminal ileum without evidence of acute inflammation.  Vascular/Lymphatic: Normal caliber abdominal aorta. Prominent right lower quadrant lymph nodes measure up to 6 mm.  Reproductive: Prostate is unremarkable.  Other: No significant abdominopelvic free fluid.  Musculoskeletal: No acute osseous abnormality.  IMPRESSION: 1. No acute abnormality in the abdomen or pelvis. Specifically normal appendix and no evidence of obstructive uropathy. 2. 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 3. Prominent right lower quadrant lymph nodes measure up to 6 mm, favored reactive etiology.   Electronically Signed By: Maudry Mayhew M.D. On: 09/05/2021 12:08   Assessment & Plan:    1. Nephrolithiasis (Primary) Dietary handout given -followup PRN - Urinalysis, Routine w reflex microscopic   No  follow-ups on file.  Wilkie Aye, MD  [facility] [place]

## 2023-04-16 NOTE — Patient Instructions (Signed)

## 2023-04-25 ENCOUNTER — Encounter: Payer: Self-pay | Admitting: Urology

## 2023-10-20 ENCOUNTER — Ambulatory Visit: Payer: Self-pay

## 2023-10-20 NOTE — Telephone Encounter (Signed)
 FYI Only or Action Required?: FYI only for provider.  Patient was last seen in primary care on n/a.  Called Nurse Triage reporting Advice Only.  Symptoms began today.  Triage Disposition: Information or Advice Only Call  Patient/caregiver understands and will follow disposition?: Yes  Copied from CRM #[id number]. Topic: Clinical - Medical Advice >> Oct 20, 2023  9:09 AM Joshua Duffy wrote: Reason for CRM: Patient is scheduled for a establish care appointment on 02/08/2024, has Asperger's and his current pcp prescribed, lisdexamfetamine (VYVANSE) 20 MG capsule as well as other medications. Grandmother Joshua Duffy concerned that patient will not have enough medication to last until his new appointment. Would like to some guidance as to what she should do and if UC will prescribe the meds while he waits to be seen.  Added to waitlist  Joshua Duffy can be reached at [phone] Reason for Disposition  Health information question, no triage required and triager able to answer question  Answer Assessment - Initial Assessment Questions 1. REASON FOR CALL: What is the main reason for your call? or How can I best help you?    Returned call and informed Grandmother to call old PCP to refill medications, also could speak with Pharmacy to request the refills for pt.  Explained [facility] [place] is not able to assist at this time due to patient has not established care yet: Grandmother verbalized understanding  Protocols used: Information Only Call - No Triage-A-AH

## 2023-11-24 DIAGNOSIS — E119 Type 2 diabetes mellitus without complications: Secondary | ICD-10-CM | POA: Diagnosis not present

## 2023-11-24 DIAGNOSIS — I10 Essential (primary) hypertension: Secondary | ICD-10-CM | POA: Diagnosis not present

## 2023-11-24 DIAGNOSIS — F909 Attention-deficit hyperactivity disorder, unspecified type: Secondary | ICD-10-CM | POA: Diagnosis not present

## 2023-11-24 DIAGNOSIS — E785 Hyperlipidemia, unspecified: Secondary | ICD-10-CM | POA: Diagnosis not present

## 2023-11-24 DIAGNOSIS — J45998 Other asthma: Secondary | ICD-10-CM | POA: Diagnosis not present

## 2023-11-24 DIAGNOSIS — K219 Gastro-esophageal reflux disease without esophagitis: Secondary | ICD-10-CM | POA: Diagnosis not present

## 2023-11-24 DIAGNOSIS — F845 Asperger's syndrome: Secondary | ICD-10-CM | POA: Diagnosis not present

## 2024-02-08 ENCOUNTER — Ambulatory Visit: Payer: MEDICAID | Admitting: Physician Assistant
